# Patient Record
Sex: Female | Born: 2000 | Race: Black or African American | Hispanic: No | Marital: Single | Smoking: Never smoker
Health system: Southern US, Community
[De-identification: ages and names within clinical notes are randomized; demographics above are authoritative.]

## PROBLEM LIST (undated history)

## (undated) DIAGNOSIS — A599 Trichomoniasis, unspecified: Secondary | ICD-10-CM

## (undated) DIAGNOSIS — F32A Depression, unspecified: Secondary | ICD-10-CM

## (undated) DIAGNOSIS — F419 Anxiety disorder, unspecified: Secondary | ICD-10-CM

## (undated) DIAGNOSIS — J3501 Chronic tonsillitis: Secondary | ICD-10-CM

## (undated) DIAGNOSIS — A749 Chlamydial infection, unspecified: Secondary | ICD-10-CM

## (undated) DIAGNOSIS — D649 Anemia, unspecified: Secondary | ICD-10-CM

## (undated) HISTORY — PX: KNEE SURGERY: SHX244

---
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
## 2022-07-04 ENCOUNTER — Emergency Department (HOSPITAL_COMMUNITY)
Admission: EM | Admit: 2022-07-04 | Discharge: 2022-07-05 | Discharge status: Against Medical Advice | Payer: Medicaid Other | Attending: Emergency Medicine | Admitting: Emergency Medicine

## 2022-07-04 ENCOUNTER — Encounter (HOSPITAL_COMMUNITY): Payer: Self-pay | Admitting: Emergency Medicine

## 2022-07-04 ENCOUNTER — Emergency Department (HOSPITAL_COMMUNITY): Payer: Medicaid Other

## 2022-07-04 DIAGNOSIS — N939 Abnormal uterine and vaginal bleeding, unspecified: Secondary | ICD-10-CM | POA: Diagnosis present

## 2022-07-04 DIAGNOSIS — Z5321 Procedure and treatment not carried out due to patient leaving prior to being seen by health care provider: Secondary | ICD-10-CM | POA: Diagnosis not present

## 2022-07-04 LAB — URINALYSIS, ROUTINE W REFLEX MICROSCOPIC
Bacteria, UA: NONE SEEN
Bilirubin Urine: NEGATIVE
Glucose, UA: NEGATIVE mg/dL
Ketones, ur: NEGATIVE mg/dL
Leukocytes,Ua: NEGATIVE
Nitrite: NEGATIVE
Protein, ur: NEGATIVE mg/dL
Specific Gravity, Urine: 1.023 (ref 1.005–1.030)
pH: 5 (ref 5.0–8.0)

## 2022-07-04 LAB — CBC WITH DIFFERENTIAL/PLATELET
Abs Immature Granulocytes: 0.01 10*3/uL (ref 0.00–0.07)
Basophils Absolute: 0 10*3/uL (ref 0.0–0.1)
Basophils Relative: 1 %
Eosinophils Absolute: 0.1 10*3/uL (ref 0.0–0.5)
Eosinophils Relative: 2 %
HCT: 44.1 % (ref 36.0–46.0)
Hemoglobin: 14.3 g/dL (ref 12.0–15.0)
Immature Granulocytes: 0 %
Lymphocytes Relative: 43 %
Lymphs Abs: 2 10*3/uL (ref 0.7–4.0)
MCH: 27.6 pg (ref 26.0–34.0)
MCHC: 32.4 g/dL (ref 30.0–36.0)
MCV: 85 fL (ref 80.0–100.0)
Monocytes Absolute: 0.5 10*3/uL (ref 0.1–1.0)
Monocytes Relative: 10 %
Neutro Abs: 2.1 10*3/uL (ref 1.7–7.7)
Neutrophils Relative %: 44 %
Platelets: 271 10*3/uL (ref 150–400)
RBC: 5.19 MIL/uL — ABNORMAL HIGH (ref 3.87–5.11)
RDW: 12.4 % (ref 11.5–15.5)
WBC: 4.7 10*3/uL (ref 4.0–10.5)
nRBC: 0 % (ref 0.0–0.2)

## 2022-07-04 LAB — COMPREHENSIVE METABOLIC PANEL
ALT: 23 U/L (ref 0–44)
AST: 19 U/L (ref 15–41)
Albumin: 3.9 g/dL (ref 3.5–5.0)
Alkaline Phosphatase: 43 U/L (ref 38–126)
Anion gap: 11 (ref 5–15)
BUN: 7 mg/dL (ref 6–20)
CO2: 22 mmol/L (ref 22–32)
Calcium: 9.5 mg/dL (ref 8.9–10.3)
Chloride: 109 mmol/L (ref 98–111)
Creatinine, Ser: 0.72 mg/dL (ref 0.44–1.00)
GFR, Estimated: >60 mL/min (ref >60)
Glucose, Bld: 90 mg/dL (ref 70–99)
Potassium: 4.2 mmol/L (ref 3.5–5.1)
Sodium: 142 mmol/L (ref 135–145)
Total Bilirubin: 0.3 mg/dL (ref 0.3–1.2)
Total Protein: 7.5 g/dL (ref 6.5–8.1)

## 2022-07-04 LAB — I-STAT BETA HCG BLOOD, ED (MC, WL, AP ONLY): I-stat hCG, quantitative: <5 m[IU]/mL (ref <5)

## 2022-07-04 NOTE — ED Notes (Signed)
Patient seen leaving ED

## 2022-07-04 NOTE — ED Triage Notes (Signed)
Patient BIB [facility] from home with complaint of waking up "covered in blood" today. Patient states she is on depo-provera injections and does not normally have an menstrual cycle. Per EMS blood was bright red.  EMS Vitals BP 118/80 HR 106 SpO2 98%

## 2022-07-04 NOTE — ED Provider Triage Note (Signed)
Emergency Medicine Provider Triage Evaluation Note  Tiffany Aguilar , a 21 y.o. female  was evaluated in triage.  Pt complains of vaginal bleeding and abdominal pain.  Brought in by ambulance.  Patient is on the Depo shot and typically does not get a menstrual period.  States she believes she may have been pregnant in April, took a large number of pregnancy tests which were mostly negative.  She did have 1 positive.  Review of Systems  Positive: As above Negative: Chest pain, shortness of breath  Physical Exam  BP 121/88 (BP Location: Right Arm)   Pulse (!) 108   Temp 98.4 F (36.9 C) (Oral)   Resp 16   SpO2 100%  Gen:   Awake, no distress   Resp:  Normal effort  MSK:   Moves extremities without difficulty  Other:  GU exam deferred in triage  Medical Decision Making  Medically screening exam initiated at 6:55 PM.  Appropriate orders placed.  Tiffany Aguilar was informed that the remainder of the evaluation will be completed by another provider, this initial triage assessment does not replace that evaluation, and the importance of remaining in the ED until their evaluation is complete.  We will order labs and ultrasound   Tiffany Aguilar, Tiffany Aguilar 07/04/22 1856

## 2022-09-03 ENCOUNTER — Ambulatory Visit (HOSPITAL_COMMUNITY)
Admission: EM | Admit: 2022-09-03 | Discharge: 2022-09-03 | Disposition: A | Discharge status: Home / Self Care | Payer: Medicaid Other | Attending: Family Medicine | Admitting: Family Medicine

## 2022-09-03 ENCOUNTER — Encounter (HOSPITAL_COMMUNITY): Payer: Self-pay

## 2022-09-03 DIAGNOSIS — H53132 Sudden visual loss, left eye: Secondary | ICD-10-CM
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
## 2022-09-03 NOTE — Discharge Instructions (Addendum)
Please go immediately to Dr. Velvet Bathe office to be seen today.

## 2022-09-03 NOTE — ED Triage Notes (Signed)
Chief Complaint: left eye swelling, pain, and vision loss. No trauma to the eye. Mom thinks she was bit by a spider. No false lashes or contacts, foreign objects in or around the eye.   Onset: when waking 3 days ago. Swelling started yesterday.   OTC medications tried: None, tried warm compress no  Sick exposure: No  New foods or medications: Yes- cayenne pepper and paprika used for the first time  Recent Travel: No

## 2022-09-20 ENCOUNTER — Encounter (HOSPITAL_COMMUNITY): Payer: Self-pay

## 2022-09-20 ENCOUNTER — Ambulatory Visit (HOSPITAL_COMMUNITY)
Admission: EM | Admit: 2022-09-20 | Discharge: 2022-09-20 | Disposition: A | Discharge status: Home / Self Care | Payer: Medicaid Other | Attending: Family Medicine | Admitting: Family Medicine

## 2022-09-20 DIAGNOSIS — U071 COVID-19: Secondary | ICD-10-CM

## 2022-09-20 DIAGNOSIS — R112 Nausea with vomiting, unspecified: Secondary | ICD-10-CM

## 2022-09-20 MED ORDER — ONDANSETRON 4 MG PO TBDP: 4.0000 mg | ORAL_TABLET | Freq: Three times a day (TID) | ORAL | 0 refills | Status: DC | PRN

## 2022-09-20 NOTE — ED Provider Notes (Signed)
[facility]    CSN: [id number] Arrival date & time: 09/20/22  1128      History   Chief Complaint Chief Complaint  Patient presents with   Covid Positive    HPI Tiffany Aguilar is a 21 y.o. female.   HPI Here for nasal congestion and cough and vomiting that began yesterday.  Is currently about 1500, and she is thrown up about 4 times today.  She is managing to keep down the Sprite that was offered her by the clinic staff, however.  She has had sore throat and myalgia.  No shortness of breath.  She did a home COVID test that was positive last night.  Last menstrual period was couple of months ago, she gets a Depo-Provera for contraception.   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      Home Medications    Prior to Admission medications   Medication Sig Start Date End Date Taking? Authorizing Provider  ondansetron (ZOFRAN-ODT) 4 MG disintegrating tablet Take 1 tablet (4 mg total) by mouth every 8 (eight) hours as needed for nausea or vomiting. 09/20/22  Yes Barrett Henle, MD    Family History Family History  Problem Relation Age of Onset   Hypertension Maternal Grandfather     Social History Social History   Tobacco Use   Smoking status: Never   Smokeless tobacco: Never  Vaping Use   Vaping Use: Never used  Substance Use Topics   Alcohol use: Never   Drug use: Never     Allergies   Blueberry [vaccinium angustifolium] and Kiwi extract   Review of Systems Review of Systems   Physical Exam Triage Vital Signs ED Triage Vitals  Enc Vitals  Group     BP 09/20/22 1431 107/71     Pulse Rate 09/20/22 1431 (!) 101     Resp 09/20/22 1431 18     Temp 09/20/22 1431 98 F (36.7 C)     Temp Source 09/20/22 1431 Oral     SpO2 09/20/22 1431 97 %     Weight --      Height --      Head Circumference --      Peak Flow --      Pain Score 09/20/22 1427 6     Pain Loc --      Pain Edu? --      Excl. in [place]? --    No data found.  Updated Vital Signs BP 107/71 (BP Location: Right Arm)   Pulse (!) 101   Temp 98 F (36.7 C) (Oral)   Resp 18   LMP  (LMP Unknown)   SpO2 97%   Visual Acuity Right Eye Distance:   Left Eye Distance:   Bilateral Distance:    Right Eye Near:   Left Eye Near:    Bilateral Near:     Physical Exam Vitals reviewed.  Constitutional:      General: She is not in acute distress.    Appearance: She is not toxic-appearing.  HENT:     Right Ear: Tympanic membrane and ear canal normal.     Left Ear: Tympanic membrane and ear canal normal.     Nose: Nose normal.     Mouth/Throat:     Mouth: Mucous membranes are moist.     Pharynx: No oropharyngeal exudate or posterior oropharyngeal erythema.  Eyes:     Extraocular Movements: Extraocular movements intact.     Conjunctiva/sclera: Conjunctivae normal.     Pupils: Pupils are equal, round, and reactive to light.  Cardiovascular:     Rate and Rhythm: Normal rate and regular rhythm.     Heart sounds: No murmur heard. Pulmonary:     Effort: Pulmonary effort is normal. No respiratory distress.     Breath sounds: No stridor. No wheezing, rhonchi or rales.  Abdominal:     Palpations: Abdomen is soft.     Tenderness: There is no abdominal tenderness.  Musculoskeletal:     Cervical back: Neck supple.  Lymphadenopathy:     Cervical: No cervical adenopathy.  Skin:    Capillary Refill: Capillary refill takes less than 2 seconds.     Coloration: Skin is not jaundiced or pale.  Neurological:     General: No focal deficit present.     Mental Status: She is  alert and oriented to person, place, and time.  Psychiatric:        Behavior: Behavior normal.      UC Treatments / Results  Labs (all labs ordered are listed, but only abnormal results are displayed) Labs Reviewed - No data to display  EKG   Radiology No results found.  Procedures Procedures (including critical care time)  Medications Ordered in UC Medications - No data to display  Initial Impression / Assessment and Plan / UC Course  I have reviewed the triage vital signs and the nursing notes.  Pertinent labs & imaging results that were available during my care of the patient were reviewed by me and considered in my medical decision making (see chart for details).        Zofran sent for her nausea.  Work note done for her quarantine. Final Clinical Impressions(s) / UC Diagnoses   Final diagnoses:  COVID  Nausea and vomiting, unspecified vomiting type     Discharge Instructions      Ondansetron dissolved in the mouth every 8 hours as needed for nausea or vomiting. Clear liquids and bland things to eat.       ED Prescriptions     Medication Sig Dispense Auth. Provider   ondansetron (ZOFRAN-ODT) 4 MG disintegrating tablet Take 1 tablet (4 mg total) by mouth every 8 (eight) hours as needed for nausea or vomiting. 10 tablet Windy Carina Gwenlyn Perking, MD      PDMP not reviewed this encounter.   Barrett Henle, MD 09/20/22 [phone]

## 2022-09-20 NOTE — Discharge Instructions (Signed)
Ondansetron dissolved in the mouth every 8 hours as needed for nausea or vomiting. Clear liquids and bland things to eat.

## 2022-09-20 NOTE — ED Triage Notes (Signed)
Pt tested positive for covid last night . Pt state she has a cough, headache, sore throat , back pain , low energy vomiting , diarrhea , fever , body aches and chills since last night

## 2022-12-18 ENCOUNTER — Encounter (HOSPITAL_COMMUNITY): Payer: Self-pay

## 2022-12-18 ENCOUNTER — Ambulatory Visit (HOSPITAL_COMMUNITY)
Admission: RE | Admit: 2022-12-18 | Discharge: 2022-12-18 | Disposition: A | Discharge status: Home / Self Care | Payer: Medicaid Other | Source: Ambulatory Visit | Attending: Internal Medicine | Admitting: Internal Medicine

## 2022-12-18 VITALS — BP 105/67 | HR 90 | Temp 98.4°F | Resp 14

## 2022-12-18 DIAGNOSIS — N898 Other specified noninflammatory disorders of vagina: Secondary | ICD-10-CM

## 2022-12-18 NOTE — ED Triage Notes (Signed)
[facility] and was [facility] for Trich and Chlamydia. Completed treatments for both and when went to make an appt yesterday with them for re-testing was told couldn't be seen for 3 months.  Pt requesting testing to make sure all clear. Denies any symptoms except odor.

## 2022-12-18 NOTE — ED Provider Notes (Signed)
[place]    CSN: [id number] Arrival date & time: 12/18/22  1039      History   Chief Complaint Chief Complaint  Patient presents with   SEXUALLY TRANSMITTED DISEASE    HPI Tiffany Aguilar is a 22 y.o. female.   Patient presents to urgent care for retesting after she tested positive for trichomonas and chlamydia during a [facility] a couple of weeks ago.  Patient was seen at the health department on December 02, 2022 where they told her that she was positive for trichomonas that day.  She started treatment with Flagyl for trichomoniasis on December 07, 2022 and took Flagyl twice daily for 7 days as prescribed.  On December 10, 2022, the health department called patient and told her that she also tested positive for chlamydia.  She started taking doxycycline twice daily for 7 days on December 10, 2022 and states she finished this medication yesterday. She states she continues to have vaginal odor. Dysuria, vaginal discharge, and vaginal irritation.  She has not had any recent new sexual partners since she was tested/treated for STDs.  Denies chance of pregnancy, receives Depo injection.  Patient would like to be retested today to "make sure she is in the clear".  Called the health department to schedule a follow-up appointment and was told that they do not have an appointment for her for 3 months.     Past Medical History:  Diagnosis Date   Anemia    Anxiety    Depression     Patient Active Problem List   Diagnosis Date Noted   MDD (major depressive disorder), single episode, severe with psychosis ([facility]) 01/18/2020   Iron deficiency anemia 01/17/2020   Menorrhagia 01/17/2020   Suicide attempt ([facility]) 01/15/2020    Past Surgical History:  Procedure Laterality Date   KNEE SURGERY      OB History   No obstetric history on file.      Home Medications    Prior to Admission medications   Medication Sig Start Date End Date Taking? Authorizing  Provider  ondansetron (ZOFRAN-ODT) 4 MG disintegrating tablet Take 1 tablet (4 mg total) by mouth every 8 (eight) hours as needed for nausea or vomiting. 09/20/22   Barrett Henle, MD    Family History Family History  Problem Relation Age of Onset   Hypertension Maternal Grandfather     Social History Social History   Tobacco Use   Smoking status: Never   Smokeless tobacco: Never  Vaping Use   Vaping Use: Never used  Substance Use Topics   Alcohol use: Never   Drug use: Never     Allergies   Blueberry [vaccinium angustifolium] and Kiwi extract   Review of Systems Review of Systems Per HPI  Physical Exam Triage Vital Signs ED Triage Vitals  Enc Vitals Group     BP 12/18/22 1112 105/67     Pulse Rate 12/18/22 1112 90     Resp 12/18/22 1112 14     Temp 12/18/22 1112 98.4 F (36.9 C)     Temp Source 12/18/22 1112 Oral     SpO2 12/18/22 1112 96 %     Weight --      Height --      Head Circumference --      Peak Flow --      Pain Score 12/18/22 1111 0     Pain Loc --      Pain Edu? --  Excl. in GC? --    No data found.  Updated Vital Signs BP 105/67 (BP Location: Left Arm)   Pulse 90   Temp 98.4 F (36.9 C) (Oral)   Resp 14   SpO2 96%   Visual Acuity Right Eye Distance:   Left Eye Distance:   Bilateral Distance:    Right Eye Near:   Left Eye Near:    Bilateral Near:     Physical Exam Vitals and nursing note reviewed.  Constitutional:      Appearance: She is not ill-appearing or toxic-appearing.  HENT:     Head: Normocephalic and atraumatic.     Right Ear: Hearing and external ear normal.     Left Ear: Hearing and external ear normal.     Nose: Nose normal.     Mouth/Throat:     Lips: Pink.  Eyes:     General: Lids are normal. Vision grossly intact. Gaze aligned appropriately.     Extraocular Movements: Extraocular movements intact.     Conjunctiva/sclera: Conjunctivae normal.  Pulmonary:     Effort: Pulmonary effort is normal.   Musculoskeletal:     Cervical back: Neck supple.  Skin:    General: Skin is warm and dry.     Capillary Refill: Capillary refill takes less than 2 seconds.     Findings: No rash.  Neurological:     General: No focal deficit present.     Mental Status: She is alert and oriented to person, place, and time. Mental status is at baseline.     Cranial Nerves: No dysarthria or facial asymmetry.  Psychiatric:        Mood and Affect: Mood normal.        Speech: Speech normal.        Behavior: Behavior normal.        Thought Content: Thought content normal.        Judgment: Judgment normal.      UC Treatments / Results  Labs (all labs ordered are listed, but only abnormal results are displayed) Labs Reviewed - No data to display  EKG   Radiology No results found.  Procedures Procedures (including critical care time)  Medications Ordered in UC Medications - No data to display  Initial Impression / Assessment and Plan / UC Course  I have reviewed the triage vital signs and the nursing notes.  Pertinent labs & imaging results that were available during my care of the patient were reviewed by me and considered in my medical decision making (see chart for details).   1.  Vaginal odor Per guidelines, test of cure for trichomonas is recommended at least 3 weeks to 3 months after completing treatment and test of cure for chlamydia should be done at least 4 weeks after treatment.  Advised patient to return to urgent care the week of January 18, 2023 for retesting should she continue to experience vaginal symptoms.  If there is new concern for new exposure to STD, patient to return to urgent care sooner for retesting.  She is agreeable with this plan and is not currently experiencing any urinary symptoms or systemic symptoms of infection.   Discussed physical exam and available lab work findings in clinic with patient.  Counseled patient regarding appropriate use of medications and potential  side effects for all medications recommended or prescribed today. Discussed red flag signs and symptoms of worsening condition,when to call the PCP office, return to urgent care, and when to seek higher level of  care in the emergency department. Patient verbalizes understanding and agreement with plan. All questions answered. Patient discharged in stable condition.    Final Clinical Impressions(s) / UC Diagnoses   Final diagnoses:  Vaginal odor     Discharge Instructions      Please return to urgent care for re-testing if you still have symptoms in 4 weeks on January 18, 2023.  No need to re-test for cure until then based on guidelines.  You will need to be re-tested for chlamydia in 3 months since you had a positive test a couple weeks ago.   Return to urgent care as needed.     ED Prescriptions   None    PDMP not reviewed this encounter.   Talbot Grumbling,  12/18/22 1149

## 2022-12-18 NOTE — Discharge Instructions (Addendum)
Please return to urgent care for re-testing if you still have symptoms in 4 weeks on January 18, 2023.  No need to re-test for cure until then based on guidelines.  You will need to be re-tested for chlamydia in 3 months since you had a positive test a couple weeks ago.   Return to urgent care as needed.

## 2023-01-23 ENCOUNTER — Ambulatory Visit (HOSPITAL_COMMUNITY)
Admission: EM | Admit: 2023-01-23 | Discharge: 2023-01-23 | Disposition: A | Discharge status: Home / Self Care | Payer: Medicaid Other | Attending: Family Medicine | Admitting: Family Medicine

## 2023-01-23 ENCOUNTER — Encounter (HOSPITAL_COMMUNITY): Payer: Self-pay | Admitting: *Deleted

## 2023-01-23 DIAGNOSIS — Z113 Encounter for screening for infections with a predominantly sexual mode of transmission: Secondary | ICD-10-CM

## 2023-01-23 HISTORY — DX: Chlamydial infection, unspecified: A74.9

## 2023-01-23 HISTORY — DX: Trichomoniasis, unspecified: A59.9

## 2023-01-23 NOTE — Discharge Instructions (Signed)
We have sent testing for sexually transmitted infections. We will notify you of any positive results once they are received. If required, we will prescribe any medications you might need. °

## 2023-01-23 NOTE — ED Provider Notes (Signed)
  [facility]   [id number] 01/23/23 Arrival Time: 1123  ASSESSMENT & PLAN:  1. Screening for STD (sexually transmitted disease)       Discharge Instructions      We have sent testing for sexually transmitted infections. We will notify you of any positive results once they are received. If required, we will prescribe any medications you might need.      Without s/s of PID.  Labs Reviewed  CERVICOVAGINAL ANCILLARY ONLY    Pending: Unresulted Labs (From admission, onward)    None        Will notify of any positive results. Instructed to refrain from sexual activity for at least seven days.  Reviewed expectations re: course of current medical issues. Questions answered. Outlined signs and symptoms indicating need for more acute intervention. Patient verbalized understanding. After Visit Summary given.   SUBJECTIVE:  Tiffany Aguilar is a 22 y.o. female who reports [facility] & completed treatment for both. Pt states she wishes to be tested again to make sure she is clear. Denies any vaginal discharge or any sxs.   Patient's last menstrual period was 01/19/2023 (exact date).   OBJECTIVE:  Vitals:   01/23/23 1135  BP: 115/76  Pulse: 85  Resp: 16  Temp: 98.2 F (36.8 C)  TempSrc: Oral  SpO2: 99%     General appearance: alert, cooperative, appears stated age and no distress Abdomen: softr GU: deferred Skin: warm and dry Psychological: alert and cooperative; normal mood and affect.    Labs Reviewed  CERVICOVAGINAL ANCILLARY ONLY    Allergies  Allergen Reactions   Blueberry [Vaccinium Angustifolium]    Kiwi Extract     Past Medical History:  Diagnosis Date   Anemia    Anxiety    Chlamydia    Depression    Trichomoniasis    Family History  Problem Relation Age of Onset   Hypertension Maternal Grandfather    Social History   Socioeconomic History   Marital status: Single     Spouse name: Not on file   Number of children: Not on file   Years of education: Not on file   Highest education level: Not on file  Occupational History   Not on file  Tobacco Use   Smoking status: Never   Smokeless tobacco: Never  Vaping Use   Vaping Use: Never used  Substance and Sexual Activity   Alcohol use: Yes    Comment: occasionally   Drug use: Never   Sexual activity: Not Currently    Birth control/protection: None  Other Topics Concern   Not on file  Social History Narrative   Mom, dad, sister, 3 brothers and 5 Dogs   Social Determinants of Health   Financial Resource Strain: Not on file  Food Insecurity: Not on file  Transportation Needs: Not on file  Physical Activity: Not on file  Stress: Not on file  Social Connections: Not on file  Intimate Partner Violence: Not on file           [place], MD 01/23/23 1216

## 2023-01-23 NOTE — ED Triage Notes (Signed)
Pt reports [facility] & completed treatment for both. Pt states she wishes to be tested again to make sure she is clear. Denies any vaginal discharge or any sxs.

## 2023-01-25 LAB — CERVICOVAGINAL ANCILLARY ONLY
Bacterial Vaginitis (gardnerella): NEGATIVE
Chlamydia: NEGATIVE
Comment: NEGATIVE
Comment: NEGATIVE
Comment: NEGATIVE
Comment: NORMAL
Neisseria Gonorrhea: NEGATIVE
Trichomonas: NEGATIVE

## 2023-02-12 ENCOUNTER — Ambulatory Visit (HOSPITAL_COMMUNITY)
Admission: EM | Admit: 2023-02-12 | Discharge: 2023-02-12 | Disposition: A | Discharge status: Home / Self Care | Payer: Medicaid Other | Attending: Internal Medicine | Admitting: Internal Medicine

## 2023-02-12 ENCOUNTER — Encounter (HOSPITAL_COMMUNITY): Payer: Self-pay

## 2023-02-12 DIAGNOSIS — Z3202 Encounter for pregnancy test, result negative: Secondary | ICD-10-CM | POA: Diagnosis present

## 2023-02-12 DIAGNOSIS — R110 Nausea: Secondary | ICD-10-CM | POA: Insufficient documentation

## 2023-02-12 DIAGNOSIS — R350 Frequency of micturition: Secondary | ICD-10-CM | POA: Diagnosis present

## 2023-02-12 DIAGNOSIS — N644 Mastodynia: Secondary | ICD-10-CM | POA: Diagnosis present

## 2023-02-12 LAB — POCT URINALYSIS DIP (MANUAL ENTRY)
Bilirubin, UA: NEGATIVE
Blood, UA: NEGATIVE
Glucose, UA: NEGATIVE mg/dL
Ketones, POC UA: NEGATIVE mg/dL
Leukocytes, UA: NEGATIVE
Nitrite, UA: NEGATIVE
Protein Ur, POC: NEGATIVE mg/dL
Spec Grav, UA: >=1.03 — AB (ref 1.010–1.025)
Urobilinogen, UA: 1 E.U./dL
pH, UA: 5.5 (ref 5.0–8.0)

## 2023-02-12 LAB — POCT URINE PREGNANCY: Preg Test, Ur: NEGATIVE

## 2023-02-12 NOTE — Discharge Instructions (Addendum)
Urine pregnancy test was negative.  Urine test did not show any obvious abnormalities.  Your vaginal swab, blood work for pregnancy, basic blood work is pending.  Will call if it is abnormal and treat as appropriate.  It would be reasonable for you to follow-up with your family medicine doctor and gynecology at provided contact information for follow-up.

## 2023-02-12 NOTE — ED Provider Notes (Signed)
[facility]    CSN: [id number] Arrival date & time: 02/12/23  1001      History   Chief Complaint Chief Complaint  Patient presents with   requesting pregnancy test    HPI Tiffany Aguilar is a 22 y.o. female.   Patient presents today requesting a pregnancy test.  Patient reports that her last menstrual cycle was 01/19/2023.  She states that she was previously not having regular menstrual cycles given that she was taking birth control.  She recently stopped birth control and the most recent menstrual cycle was the first 1 since she stopped birth control.  She is concerned for pregnancy given 2-week history of bilateral breast tenderness, nausea without vomiting, mild urinary frequency.  Reports she took 2 pregnancy tests at home that were negative.  She does have some mild clear vaginal discharge that has been present for a few days as well.  Although, she denies any exposure to STD.  Reports she was recently tested for STDs but has had unprotected sexual intercourse since that testing occurred.  She has not noticed any lumps or rashes to her breasts.  Reports she was pregnant in September 2023 but states that she fell which caused a miscarriage.  Denies abdominal pain, vaginal bleeding, dysuria, diarrhea, constipation.  Denies any fever.     Past Medical History:  Diagnosis Date   Anemia    Anxiety    Chlamydia    Depression    Trichomoniasis     Patient Active Problem List   Diagnosis Date Noted   MDD (major depressive disorder), single episode, severe with psychosis ([facility]) 01/18/2020   Iron deficiency anemia 01/17/2020   Menorrhagia 01/17/2020   Suicide attempt ([facility]) 01/15/2020    Past Surgical History:  Procedure Laterality Date   KNEE SURGERY      OB History   No obstetric history on file.      Home Medications    Prior to Admission medications   Medication Sig Start Date End Date Taking? Authorizing Provider  ondansetron (ZOFRAN-ODT) 4 MG  disintegrating tablet Take 1 tablet (4 mg total) by mouth every 8 (eight) hours as needed for nausea or vomiting. 09/20/22   Zenia Resides, MD    Family History Family History  Problem Relation Age of Onset   Hypertension Maternal Grandfather     Social History Social History   Tobacco Use   Smoking status: Never   Smokeless tobacco: Never  Vaping Use   Vaping Use: Never used  Substance Use Topics   Alcohol use: Yes    Comment: occasionally   Drug use: Never     Allergies   Blueberry [vaccinium angustifolium] and Kiwi extract   Review of Systems Review of Systems Per HPI  Physical Exam Triage Vital Signs ED Triage Vitals  Enc Vitals Group     BP 02/12/23 1116 120/81     Pulse Rate 02/12/23 1116 93     Resp 02/12/23 1116 16     Temp 02/12/23 1116 98 F (36.7 C)     Temp Source 02/12/23 1116 Oral     SpO2 02/12/23 1116 99 %     Weight --      Height --      Head Circumference --      Peak Flow --      Pain Score 02/12/23 1117 7     Pain Loc --      Pain Edu? --      Excl. in  GC? --    No data found.  Updated Vital Signs BP 120/81 (BP Location: Left Arm)   Pulse 93   Temp 98 F (36.7 C) (Oral)   Resp 16   LMP 01/19/2023 (Exact Date)   SpO2 99%   Visual Acuity Right Eye Distance:   Left Eye Distance:   Bilateral Distance:    Right Eye Near:   Left Eye Near:    Bilateral Near:     Physical Exam Exam conducted with a chaperone present.  Constitutional:      General: She is not in acute distress.    Appearance: Normal appearance. She is not toxic-appearing or diaphoretic.  HENT:     Head: Normocephalic and atraumatic.  Eyes:     Extraocular Movements: Extraocular movements intact.     Conjunctiva/sclera: Conjunctivae normal.  Pulmonary:     Effort: Pulmonary effort is normal.  Chest:     Comments: Tenderness to palpation throughout bilateral breasts.  There are no obvious lumps, masses, rashes noted.  Nipples appear  normal. Genitourinary:    Comments: Deferred with shared decision making.  Self swab performed. Neurological:     General: No focal deficit present.     Mental Status: She is alert and oriented to person, place, and time. Mental status is at baseline.  Psychiatric:        Mood and Affect: Mood normal.        Behavior: Behavior normal.        Thought Content: Thought content normal.        Judgment: Judgment normal.      UC Treatments / Results  Labs (all labs ordered are listed, but only abnormal results are displayed) Labs Reviewed  POCT URINALYSIS DIP (MANUAL ENTRY) - Abnormal; Notable for the following components:      Result Value   Color, UA straw (*)    Spec Grav, UA >=1.030 (*)    All other components within normal limits  POCT URINE PREGNANCY  CERVICOVAGINAL ANCILLARY ONLY    EKG   Radiology No results found.  Procedures Procedures (including critical care time)  Medications Ordered in UC Medications - No data to display  Initial Impression / Assessment and Plan / UC Course  I have reviewed the triage vital signs and the nursing notes.  Pertinent labs & imaging results that were available during my care of the patient were reviewed by me and considered in my medical decision making (see chart for details).     Urine pregnancy test was negative.  Discussed obtaining a quantitative hCG given patient is having typical pregnancy symptoms despite urine pregnancy test being negative.  She was agreeable to this but clinical staff was not able to obtain blood work.  Unsure exact etiology of patient's symptoms but it is possible could be related to recently coming off of birth control.  Advised patient to follow-up with gynecology for further evaluation and management of this at provided contact information.  Breast exam is not notable for any lumps or lesions that need any type of imaging completed at this time.  No signs of infection either.  CMP and CBC was ordered  but as we discussed, clinical staff was not able to obtain blood work.  There are no signs of acute abdomen on exam so do not think that emergent evaluation is necessary.  Therefore, patient was advised of the importance of following up with her family medicine doctor for recheck and follow-up.  UA was completed given  urinary frequency but it was unremarkable.  Will obtain cervicovaginal swab given patient's reported vaginal discharge.  Discussed strict return and ER precautions.  Patient verbalized understanding and was agreeable with plan. Final Clinical Impressions(s) / UC Diagnoses   Final diagnoses:  Urine pregnancy test negative  Nausea without vomiting  Breast tenderness  Urinary frequency     Discharge Instructions      Urine pregnancy test was negative.  Urine test did not show any obvious abnormalities.  Your vaginal swab, blood work for pregnancy, basic blood work is pending.  Will call if it is abnormal and treat as appropriate.  It would be reasonable for you to follow-up with your family medicine doctor and gynecology at provided contact information for follow-up.     ED Prescriptions   None    PDMP not reviewed this encounter.   Gustavus Bryant, [place] 02/12/23 1312

## 2023-02-12 NOTE — ED Triage Notes (Signed)
Patient is requesting a pregnancy test. Patient states that she had 2 home pregnancy tests that were negative. Patient states she has sore nipples, darkness around the nipple area, and nausea.

## 2023-02-12 NOTE — ED Notes (Signed)
Unable to obtain blood work. Attempts x 2 with 2 different staff members. Lavonne Chick., NP notified and was told to discharge the patient without blood work.

## 2023-02-15 LAB — CERVICOVAGINAL ANCILLARY ONLY
Bacterial Vaginitis (gardnerella): NEGATIVE
Candida Glabrata: POSITIVE — AB
Candida Vaginitis: NEGATIVE
Chlamydia: NEGATIVE
Comment: NEGATIVE
Comment: NEGATIVE
Comment: NEGATIVE
Comment: NEGATIVE
Comment: NEGATIVE
Comment: NORMAL
Neisseria Gonorrhea: NEGATIVE
Trichomonas: NEGATIVE

## 2023-02-16 ENCOUNTER — Telehealth (HOSPITAL_COMMUNITY): Payer: Self-pay | Admitting: Emergency Medicine

## 2023-02-16 MED ORDER — FLUCONAZOLE 150 MG PO TABS: 150.0000 mg | ORAL_TABLET | Freq: Once | ORAL | 0 refills | Status: AC

## 2023-03-01 ENCOUNTER — Ambulatory Visit (HOSPITAL_COMMUNITY)
Admission: EM | Admit: 2023-03-01 | Discharge: 2023-03-01 | Disposition: A | Discharge status: Home / Self Care | Payer: Medicaid Other | Attending: Internal Medicine | Admitting: Internal Medicine

## 2023-03-01 ENCOUNTER — Encounter (HOSPITAL_COMMUNITY): Payer: Self-pay | Admitting: *Deleted

## 2023-03-01 ENCOUNTER — Other Ambulatory Visit: Payer: Self-pay

## 2023-03-01 DIAGNOSIS — J039 Acute tonsillitis, unspecified: Secondary | ICD-10-CM | POA: Diagnosis present

## 2023-03-01 LAB — POCT URINE PREGNANCY: Preg Test, Ur: NEGATIVE

## 2023-03-01 LAB — POCT RAPID STREP A (OFFICE): Rapid Strep A Screen: NEGATIVE

## 2023-03-01 MED ORDER — ACETAMINOPHEN 160 MG/5ML PO SOLN
500.0000 mg | Freq: Once | ORAL | Status: AC
  Administered 2023-03-01: 500 mg via ORAL

## 2023-03-01 MED ORDER — ONDANSETRON 4 MG PO TBDP: 4.0000 mg | ORAL_TABLET | Freq: Two times a day (BID) | ORAL | 0 refills | Status: AC

## 2023-03-01 MED ORDER — ONDANSETRON 4 MG PO TBDP
4.0000 mg | ORAL_TABLET | Freq: Once | ORAL | Status: AC
  Administered 2023-03-01: 4 mg via ORAL

## 2023-03-01 MED ORDER — ONDANSETRON 4 MG PO TBDP
ORAL_TABLET | ORAL | Status: AC
  Filled 2023-03-01: qty 1

## 2023-03-01 MED ORDER — ACETAMINOPHEN 160 MG/5ML PO SUSP
ORAL | Status: AC
  Filled 2023-03-01: qty 20

## 2023-03-01 MED ORDER — AMOXICILLIN 400 MG/5ML PO SUSR: 500.0000 mg | Freq: Two times a day (BID) | ORAL | 0 refills | Status: AC

## 2023-03-01 NOTE — ED Provider Notes (Signed)
[facility]    CSN: [id number] Arrival date & time: 03/01/23  1013     History   Chief Complaint Chief Complaint  Patient presents with   Sore Throat   Headache   Generalized Body Aches   Emesis    HPI Tiffany Aguilar is a 22 y.o. female.  Yesterday developed sore throat, pain with swallowing.  Reports 10/10 pain today. Also had bodyaches and chills last night.  No known fever.  Denies cough Reports a few episodes of vomiting last night into early this morning. 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      Home Medications    Prior to Admission medications   Medication Sig Start Date End Date Taking? Authorizing Provider  amoxicillin (AMOXIL) 400 MG/5ML suspension Take 6.3 mLs (500 mg total) by mouth 2 (two) times daily for 10 days. 03/01/23 03/11/23 Yes Floyd Lusignan, PA-C  ondansetron (ZOFRAN-ODT) 4 MG disintegrating tablet Take 1 tablet (4 mg total) by mouth 2 (two) times daily for 4 days. Dissolve 1 tablet under the tongue 30 minutes before antibiotic use 03/01/23 03/05/23 Yes Galit Urich, Lurena Joiner, PA-C    Family History Family History  Problem Relation Age of Onset   Hypertension Maternal Grandfather     Social History Social History   Tobacco Use   Smoking status: Never   Smokeless tobacco: Never  Vaping Use   Vaping Use: Never used  Substance Use Topics   Alcohol use: Yes    Comment: occasionally   Drug use: Never      Allergies   Blueberry [vaccinium angustifolium] and Kiwi extract   Review of Systems Review of Systems As per HPI  Physical Exam Triage Vital Signs ED Triage Vitals  Enc Vitals Group     BP 03/01/23 1144 106/73     Pulse Rate 03/01/23 1144 (!) 110     Resp 03/01/23 1144 18     Temp 03/01/23 1144 98.3 F (36.8 C)     Temp src --      SpO2 03/01/23 1144 98 %     Weight --      Height --      Head Circumference --      Peak Flow --      Pain Score 03/01/23 1142 10     Pain Loc --      Pain Edu? --      Excl. in GC? --    No data found.  Updated Vital Signs BP 106/73   Pulse (!) 110   Temp 98.3 F (36.8 C)   Resp 18   LMP 01/18/2023   SpO2 98%     Physical Exam Vitals and nursing note reviewed.  Constitutional:      General: She is not in acute distress.    Appearance: She is not ill-appearing.  HENT:     Right  Ear: Tympanic membrane and ear canal normal.     Left Ear: Tympanic membrane and ear canal normal.     Mouth/Throat:     Mouth: Mucous membranes are moist.     Pharynx: Oropharynx is clear. Posterior oropharyngeal erythema present.     Tonsils: Tonsillar exudate present. No tonsillar abscesses. 3+ on the right. 3+ on the left.     Comments: Erythematous throat and tonsils. 3+ swelling with copious exudate bilaterally. Tolerating secretions and normal phonation             Eyes:     Conjunctiva/sclera: Conjunctivae normal.  Cardiovascular:     Rate and Rhythm: Normal rate and regular rhythm.     Heart sounds: Normal heart sounds.  Pulmonary:     Effort: Pulmonary effort is normal.     Breath sounds: Normal breath sounds.  Abdominal:     Tenderness: There is no abdominal tenderness.  Musculoskeletal:        General: Normal range of motion.     Cervical back: Normal range of motion. No rigidity.  Lymphadenopathy:     Cervical: No cervical adenopathy.  Skin:    General: Skin is warm and dry.     Findings: No rash.  Neurological:      Mental Status: She is alert and oriented to person, place, and time.     UC Treatments / Results  Labs (all labs ordered are listed, but only abnormal results are displayed) Labs Reviewed  CULTURE, GROUP A STREP [facility])  POCT RAPID STREP A (OFFICE)  POCT URINE PREGNANCY    EKG  Radiology No results found.  Procedures Procedures (including critical care time)  Medications Ordered in UC Medications  ondansetron (ZOFRAN-ODT) disintegrating tablet 4 mg (4 mg Oral Given 03/01/23 1235)  acetaminophen (TYLENOL) 160 MG/5ML solution 500 mg (500 mg Oral Given 03/01/23 1234)    Initial Impression / Assessment and Plan / UC Course  I have reviewed the triage vital signs and the nursing notes.  Pertinent labs & imaging results that were available during my care of the patient were reviewed by me and considered in my medical decision making (see chart for details).  UPT negative Rapid strep negative, culture is pending. Given appearance will start empirical treatment for tonsillitis with amox BID x 10 days. D/C if culture negative.  Tylenol dose given in clinic for pain, zofran for nausea. PO challenge successful. Recommend zofran before taking abx. Tylenol for pain control. School note provided. Return precautions discussed. Patient agrees to plan  Final Clinical Impressions(s) / UC Diagnoses   Final diagnoses:  Acute tonsillitis, unspecified etiology     Discharge Instructions      I am treating you for a throat infection.  However we are still sending the throat swab off for culture.  If nothing grows, we will call you and have you discontinue the antibiotic.  For now take the antibiotic amoxicillin twice daily (every 12 hours) for 10 days. I recommend to use Zofran/nausea medicine about 30 minutes before taking the antibiotic. This will help to keep the stomach settled. Use for the next 2-3 days while you are getting appetite back. If you can, eat something before taking the  antibiotic as it can upset you on an empty stomach.   Drink lots of water.  Take tylenol every 4-6 hours to control pain in the meantime.  Please change your toothbrush in 2-3 days to prevent reinfection.     ED Prescriptions  Medication Sig Dispense Auth. Provider   amoxicillin (AMOXIL) 400 MG/5ML suspension Take 6.3 mLs (500 mg total) by mouth 2 (two) times daily for 10 days. 126 mL Medard Decuir, PA-C   ondansetron (ZOFRAN-ODT) 4 MG disintegrating tablet Take 1 tablet (4 mg total) by mouth 2 (two) times daily for 4 days. Dissolve 1 tablet under the tongue 30 minutes before antibiotic use 8 tablet Reese Senk, Lurena Joiner, PA-C      PDMP not reviewed this encounter.   Kathrine Haddock 03/01/23 1341

## 2023-03-01 NOTE — ED Triage Notes (Signed)
Pt reports since Sunday morning she has had body aches,HA,sore throat has vomited

## 2023-03-01 NOTE — Discharge Instructions (Addendum)
I am treating you for a throat infection.  However we are still sending the throat swab off for culture.  If nothing grows, we will call you and have you discontinue the antibiotic.  For now take the antibiotic amoxicillin twice daily (every 12 hours) for 10 days. I recommend to use Zofran/nausea medicine about 30 minutes before taking the antibiotic. This will help to keep the stomach settled. Use for the next 2-3 days while you are getting appetite back. If you can, eat something before taking the antibiotic as it can upset you on an empty stomach.   Drink lots of water.  Take tylenol every 4-6 hours to control pain in the meantime.  Please change your toothbrush in 2-3 days to prevent reinfection.

## 2023-03-02 ENCOUNTER — Other Ambulatory Visit: Payer: Self-pay

## 2023-03-02 ENCOUNTER — Emergency Department (HOSPITAL_COMMUNITY)
Admission: EM | Admit: 2023-03-02 | Discharge: 2023-03-02 | Disposition: A | Discharge status: Home / Self Care | Payer: Medicaid Other | Attending: Emergency Medicine | Admitting: Emergency Medicine

## 2023-03-02 ENCOUNTER — Encounter (HOSPITAL_COMMUNITY): Payer: Self-pay

## 2023-03-02 DIAGNOSIS — J029 Acute pharyngitis, unspecified: Secondary | ICD-10-CM | POA: Diagnosis present

## 2023-03-02 DIAGNOSIS — Z1152 Encounter for screening for COVID-19: Secondary | ICD-10-CM | POA: Insufficient documentation

## 2023-03-02 LAB — SARS CORONAVIRUS 2 BY RT PCR: SARS Coronavirus 2 by RT PCR: NEGATIVE

## 2023-03-02 LAB — GROUP A STREP BY PCR: Group A Strep by PCR: NOT DETECTED

## 2023-03-02 LAB — MONONUCLEOSIS SCREEN: Mono Screen: NEGATIVE

## 2023-03-02 MED ORDER — ONDANSETRON 4 MG PO TBDP
4.0000 mg | ORAL_TABLET | Freq: Once | ORAL | Status: DC
  Filled 2023-03-02: qty 1

## 2023-03-02 MED ORDER — DEXAMETHASONE SODIUM PHOSPHATE 10 MG/ML IJ SOLN
10.0000 mg | Freq: Once | INTRAMUSCULAR | Status: AC
  Administered 2023-03-02: 10 mg via INTRAMUSCULAR
  Filled 2023-03-02: qty 1

## 2023-03-02 NOTE — ED Notes (Signed)
Patient given sandwich and water and tolerated both with no nausea or vomiting.

## 2023-03-02 NOTE — Discharge Instructions (Addendum)
Your mononucleosis, COVID-19 and strep testing was negative.  Recommend you finish out your course of antibiotics.  You were administered a steroid.  Recommend you continue to push oral fluids to maintain hydration, return for any severe worsening neck swelling or pain, return for inability to swallow or tolerate oral fluids as this can result in dehydration, take Tylenol and ibuprofen for pain control and fever control.  Follow-up with your PCP to ensure resolution

## 2023-03-02 NOTE — ED Provider Notes (Signed)
[place] EMERGENCY DEPARTMENT AT [facility] Provider Note   CSN: [id number] Arrival date & time: 03/02/23  1041     History  Chief Complaint  Patient presents with   Sore Throat   Emesis    Tiffany Aguilar is a 22 y.o. female.   Sore Throat  Emesis Associated symptoms: sore throat      22 year old female presenting to the emergency department with a sore throat for the past 2 days.  She endorses a bilateral sore throat with tonsillar swelling.  She endorses difficulty and pain with swallowing.  She denies any difficulty breathing.  She denies any chest pain or shortness of breath.  She had two episodes of nausea and vomiting earlier today but denies any current nausea.  She has had some diarrhea.  No abdominal pain.  She was recently started on amoxicillin after being seen in urgent care due to concern for possible strep throat.  She endorses generalized bodyaches.  No known fevers.  Endorses chills.  Urine pregnancy at urgent care on 5/13 was negative.  A rapid strep was negative and a culture was pending so the patient was started on amoxicillin for 10 days.  Patient was provided with Zofran for nausea and advised Tylenol.  She presents back to the emergency department due to concern for worsening pharyngitis.  Home Medications Prior to Admission medications   Medication Sig Start Date End Date Taking? Authorizing Provider  amoxicillin (AMOXIL) 400 MG/5ML suspension Take 6.3 mLs (500 mg total) by mouth 2 (two) times daily for 10 days. 03/01/23 03/11/23  Rising, Rebecca, PA-C  ondansetron (ZOFRAN-ODT) 4 MG disintegrating tablet Take 1 tablet (4 mg total) by mouth 2 (two) times daily for 4 days. Dissolve 1 tablet under the tongue 30 minutes before antibiotic use 03/01/23 03/05/23  Rising, Lurena Joiner, PA-C      Allergies    Blueberry [vaccinium angustifolium] and Kiwi extract    Review of Systems   Review of Systems  HENT:  Positive for sore throat.   Gastrointestinal:   Positive for vomiting.  All other systems reviewed and are negative.   Physical Exam Updated Vital Signs BP 107/71 (BP Location: Right Arm)   Pulse 100   Temp 97.8 F (36.6 C) (Oral)   Resp 17   Ht 5\' 3"  (1.6 m)   Wt 53.1 kg   LMP 01/18/2023   SpO2 100%   BMI 20.74 kg/m  Physical Exam Vitals and nursing note reviewed.  Constitutional:      General: She is not in acute distress.    Appearance: She is well-developed.  HENT:     Head: Normocephalic and atraumatic.     Mouth/Throat:     Pharynx: Posterior oropharyngeal erythema present.     Tonsils: Tonsillar exudate present. No tonsillar abscesses. 2+ on the right. 2+ on the left.  Eyes:     Conjunctiva/sclera: Conjunctivae normal.  Cardiovascular:     Rate and Rhythm: Normal rate and regular rhythm.  Pulmonary:     Effort: Pulmonary effort is normal. No respiratory distress.     Breath sounds: Normal breath sounds.  Abdominal:     Palpations: Abdomen is soft.     Tenderness: There is no abdominal tenderness.  Musculoskeletal:        General: No swelling.     Cervical back: Neck supple.  Skin:    General: Skin is warm and dry.     Capillary Refill: Capillary refill takes less than 2 seconds.  Neurological:  Mental Status: She is alert.  Psychiatric:        Mood and Affect: Mood normal.     ED Results / Procedures / Treatments   Labs (all labs ordered are listed, but only abnormal results are displayed) Labs Reviewed  SARS CORONAVIRUS 2 BY RT PCR  GROUP A STREP BY PCR  MONONUCLEOSIS SCREEN    EKG None  Radiology No results found.  Procedures Procedures    Medications Ordered in ED Medications  dexamethasone (DECADRON) injection 10 mg (10 mg Intramuscular Given 03/02/23 1243)    ED Course/ Medical Decision Making/ A&P                             Medical Decision Making Amount and/or Complexity of Data Reviewed Labs: ordered.  Risk Prescription drug management.    22 year old female  presenting to the emergency department with a sore throat for the past 2 days.  She endorses a bilateral sore throat with tonsillar swelling.  She endorses difficulty and pain with swallowing.  She denies any difficulty breathing.  She denies any chest pain or shortness of breath.  She had two episodes of nausea and vomiting earlier today but denies any current nausea.  She has had some diarrhea.  No abdominal pain.  She was recently started on amoxicillin after being seen in urgent care due to concern for possible strep throat.  She endorses generalized bodyaches.  No known fevers.  Endorses chills.  Urine pregnancy at urgent care on 5/13 was negative.  A rapid strep was negative and a culture was pending so the patient was started on amoxicillin for 10 days.  Patient was provided with Zofran for nausea and advised Tylenol.  She presents back to the emergency department due to concern for worsening pharyngitis.  On arrival, the patient was vitally stable, afebrile, not tachycardic or tachypneic, hemodynamically stable.  Physical exam significant for a bilateral pharyngitis with tonsillar exudate.  No unilateral component to suggest peritonsillar abscess and none appreciated on exam.  No tongue elevation to suggest Ludwig's Angina or tenderness submandibularly.  Good range of motion of the neck with low concern for RPA.  Patient presenting with a nonspecific pharyngitis.  She is currently on amoxicillin however was only able to get the medicine yesterday and has not fully started it.  Presenting with worsening pain and swelling of the throat with a pharyngitis seen on exam.  Repeat COVID-19 and influenza PCR testing was collected and resulted negative, mononucleosis screen also resulted negative, group A strep PCR was collected and is negative.  A culture had previously been collected and is still pending reintubation at this time, not fully resulted yet.  Given the worsening pharyngitis, the patient was  administered IM Decadron.  Following this, the patient endorsed significant symptomatic improvement and was able to tolerate oral intake.  She felt symptomatically improved.  I recommended the patient continue to complete her course of amoxicillin as previously prescribed, follow-up on her previous culture results.  Stable for discharge with return precautions provided.   Final Clinical Impression(s) / ED Diagnoses Final diagnoses:  Pharyngitis, unspecified etiology    Rx / DC Orders ED Discharge Orders     None         Ernie Avena, MD 03/03/23 0710

## 2023-03-02 NOTE — ED Triage Notes (Signed)
Pt c/o N/V, sore throatx2d. Pt states her tonsils are now having int swelling. Pt c/o dysphagia. Pt states she vomited 2 times in last 24 hrs. Pt c/o diarrhea every hour

## 2023-03-03 LAB — CULTURE, GROUP A STREP (THRC)

## 2023-03-12 ENCOUNTER — Encounter (HOSPITAL_COMMUNITY): Payer: Self-pay | Admitting: Emergency Medicine

## 2023-03-12 ENCOUNTER — Ambulatory Visit (INDEPENDENT_AMBULATORY_CARE_PROVIDER_SITE_OTHER): Payer: Medicaid Other

## 2023-03-12 ENCOUNTER — Ambulatory Visit (HOSPITAL_COMMUNITY)
Admission: EM | Admit: 2023-03-12 | Discharge: 2023-03-12 | Disposition: A | Discharge status: Home / Self Care | Payer: Medicaid Other

## 2023-03-12 ENCOUNTER — Other Ambulatory Visit: Payer: Self-pay

## 2023-03-12 DIAGNOSIS — R519 Headache, unspecified: Secondary | ICD-10-CM | POA: Diagnosis not present

## 2023-03-12 DIAGNOSIS — M25522 Pain in left elbow: Secondary | ICD-10-CM | POA: Diagnosis not present

## 2023-03-12 DIAGNOSIS — S0990XA Unspecified injury of head, initial encounter: Secondary | ICD-10-CM

## 2023-03-12 DIAGNOSIS — S0083XA Contusion of other part of head, initial encounter: Secondary | ICD-10-CM | POA: Diagnosis not present

## 2023-03-12 MED ORDER — METHOCARBAMOL 500 MG PO TABS: 500.0000 mg | ORAL_TABLET | Freq: Two times a day (BID) | ORAL | 0 refills | Status: DC

## 2023-03-12 MED ORDER — KETOROLAC TROMETHAMINE 30 MG/ML IJ SOLN
INTRAMUSCULAR | Status: AC
  Filled 2023-03-12: qty 1

## 2023-03-12 MED ORDER — ACETAMINOPHEN 325 MG PO TABS
ORAL_TABLET | ORAL | Status: AC
  Filled 2023-03-12: qty 3

## 2023-03-12 MED ORDER — ACETAMINOPHEN 325 MG PO TABS
975.0000 mg | ORAL_TABLET | Freq: Once | ORAL | Status: AC
  Administered 2023-03-12: 975 mg via ORAL

## 2023-03-12 MED ORDER — KETOROLAC TROMETHAMINE 30 MG/ML IJ SOLN
30.0000 mg | Freq: Once | INTRAMUSCULAR | Status: AC
  Administered 2023-03-12: 30 mg via INTRAMUSCULAR
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
## 2023-05-23 ENCOUNTER — Emergency Department (HOSPITAL_COMMUNITY)
Admission: EM | Admit: 2023-05-23 | Discharge: 2023-05-23 | Disposition: A | Discharge status: Home / Self Care | Payer: MEDICAID | Attending: Emergency Medicine | Admitting: Emergency Medicine

## 2023-05-23 ENCOUNTER — Other Ambulatory Visit: Payer: Self-pay

## 2023-05-23 DIAGNOSIS — J039 Acute tonsillitis, unspecified: Secondary | ICD-10-CM | POA: Diagnosis not present

## 2023-05-23 DIAGNOSIS — J029 Acute pharyngitis, unspecified: Secondary | ICD-10-CM | POA: Diagnosis present

## 2023-05-23 MED ORDER — IBUPROFEN 800 MG PO TABS
800.0000 mg | ORAL_TABLET | Freq: Once | ORAL | Status: AC
  Administered 2023-05-23: 800 mg via ORAL
  Filled 2023-05-23: qty 1

## 2023-05-23 MED ORDER — LIDOCAINE VISCOUS HCL 2 % MT SOLN: 15.0000 mL | OROMUCOSAL | 0 refills | Status: DC | PRN

## 2023-05-23 MED ORDER — DEXAMETHASONE 4 MG PO TABS
6.0000 mg | ORAL_TABLET | Freq: Once | ORAL | Status: AC
  Administered 2023-05-23: 6 mg via ORAL
  Filled 2023-05-23: qty 2

## 2023-05-23 MED ORDER — DEXAMETHASONE 6 MG PO TABS: 6.0000 mg | ORAL_TABLET | Freq: Once | ORAL | 0 refills | Status: AC

## 2023-05-23 MED ORDER — CEPACOL REGULAR STRENGTH 3 MG MT LOZG: 1.0000 | LOZENGE | OROMUCOSAL | 12 refills | Status: DC | PRN

## 2023-05-23 MED ORDER — LIDOCAINE VISCOUS HCL 2 % MT SOLN
15.0000 mL | Freq: Once | OROMUCOSAL | Status: AC
  Administered 2023-05-23: 15 mL via OROMUCOSAL
  Filled 2023-05-23: qty 15

## 2023-05-23 NOTE — ED Triage Notes (Signed)
Pt/mother stated, I've had swollen tonsils since May off and on. I've gone to several facilities and they have ran every kink of test and everything is neg. They said if  we want  more extensive test she will have to be admitted to ICU. If I need that its ok.

## 2023-05-23 NOTE — Discharge Instructions (Signed)
You were seen in the emergency department for your sore throat.  Her tonsils are swollen but you have no signs of abscess or other infection that needed antibiotics.  I have given you some steroids that should help with the pain and swelling and you can take a second dose in 24 to 48 hours.  You can also take ibuprofen up to every 6 hours, use lidocaine solution or throat lozenges to help with the pain.  You should return to the emergency department if you are having fevers, you are unable to open your mouth, you are unable to swallow your own saliva or if you have any other new or concerning symptoms.

## 2023-05-23 NOTE — ED Provider Notes (Signed)
[place] [facility] AT [facility] Provider Note   CSN: [id number] Arrival date & time: 05/23/23  4098     History  Chief Complaint  Patient presents with   Sore Throat    Tiffany Aguilar is a 22 y.o. female.  Patient is a 22 year old female with no significant past medical history presenting to the emergency department for sore throat.  The patient states that she has had a sore throat for about the last week.  She states that her tonsils feel swollen.  She states that she has a mild cough and some bodyaches.  She denies any known fevers.  She states that she has pain with swallowing but is still able to swallow liquids and her own saliva.  She states that she has had some nausea but denies any vomiting or diarrhea.  She states that she has had on and off throat swelling for the last several months.  She states that she was seen at an outside ED few days ago and tested negative for COVID, flu, strep and mono.  She states that she was told that if she "needed more viral testing would have to be admitted to the ICU but is not severe enough for that at this time".  The history is provided by the patient and a parent.  Sore Throat       Home Medications Prior to Admission medications   Medication Sig Start Date End Date Taking? Authorizing Provider  dexamethasone (DECADRON) 6 MG tablet Take 1 tablet (6 mg total) by mouth once for 1 dose. Take in 24-48 hours from your first dose you received in the ER 05/23/23 05/23/23 Yes Theresia Lo, [place] K, DO  lidocaine (XYLOCAINE) 2 % solution Use as directed 15 mLs in the mouth or throat as needed for mouth pain. 05/23/23  Yes Theresia Lo, [place] K, DO  menthol-cetylpyridinium (CEPACOL REGULAR STRENGTH) 3 MG lozenge Take 1 lozenge (3 mg total) by mouth as needed for sore throat. 05/23/23  Yes Theresia Lo, Benetta Spar K, DO  AMOXICILLIN PO Take by mouth.    [provider]  methocarbamol (ROBAXIN) 500 MG tablet Take 1 tablet (500 mg  total) by mouth 2 (two) times daily. 03/12/23   Carlisle Beers, FNP      Allergies    Blueberry [vaccinium angustifolium] and Kiwi extract    Review of Systems   Review of Systems  Physical Exam Updated Vital Signs BP 111/75 (BP Location: Left Arm)   Pulse 86   Temp 98.4 F (36.9 C) (Oral)   Resp 12   Ht 5\' 3"  (1.6 m)   Wt 54.4 kg   SpO2 100%   BMI 21.26 kg/m  Physical Exam Vitals and nursing note reviewed.  Constitutional:      General: She is not in acute distress.    Appearance: She is well-developed.  HENT:     Head: Normocephalic and atraumatic.     Nose: No congestion.     Mouth/Throat:     Mouth: Mucous membranes are moist.     Pharynx: Uvula midline. No oropharyngeal exudate, posterior oropharyngeal erythema or uvula swelling.     Tonsils: No tonsillar exudate or tonsillar abscesses. 2+ on the right. 2+ on the left.     Comments: Normal phonation, no trismus, tolerating secretions Eyes:     Conjunctiva/sclera: Conjunctivae normal.  Cardiovascular:     Rate and Rhythm: Normal rate and regular rhythm.     Heart sounds: Normal heart sounds.  Pulmonary:  Effort: Pulmonary effort is normal.     Breath sounds: Normal breath sounds.  Abdominal:     Palpations: Abdomen is soft.  Musculoskeletal:     Cervical back: Normal range of motion and neck supple.  Lymphadenopathy:     Cervical: Cervical adenopathy present.  Skin:    General: Skin is warm and dry.  Neurological:     General: No focal deficit present.     Mental Status: She is alert and oriented to person, place, and time.  Psychiatric:        Mood and Affect: Mood normal.        Behavior: Behavior normal.     ED Results / Procedures / Treatments   Labs (all labs ordered are listed, but only abnormal results are displayed) Labs Reviewed - No data to display  EKG None  Radiology No results found.  Procedures Procedures    Medications Ordered in ED Medications  dexamethasone  (DECADRON) tablet 6 mg (6 mg Oral Given 05/23/23 0929)  ibuprofen (ADVIL) tablet 800 mg (800 mg Oral Given 05/23/23 0929)  lidocaine (XYLOCAINE) 2 % viscous mouth solution 15 mL (15 mLs Mouth/Throat Given by Other 05/23/23 4010)    ED Course/ Medical Decision Making/ A&P                                 Medical Decision Making This patient presents to the ED with chief complaint(s) of sore throat with no pertinent past medical history which further complicates the presenting complaint. The complaint involves an extensive differential diagnosis and also carries with it a high risk of complications and morbidity.    The differential diagnosis includes viral pharyngitis, recent negative strep testing and mono testing making strep or mono less likely, no evidence of PTA, RPA or Ludwig's on exam  Additional history obtained: Additional history obtained from family Records reviewed Care Everywhere/External Records  ED Course and Reassessment: On patient's arrival to the emergency department she is hemodynamically stable in no acute distress.  She does have some tonsillar hypertrophy but no evidence of deep space infection and is nontoxic-appearing.  Patient will be given symptomatic treatment with steroids, viscous lidocaine and ibuprofen.  She will be given outpatient ENT follow-up and was given strict return precautions.  Independent labs interpretation:  N/A  Independent visualization of imaging: - N/A  Consultation: - Consulted or discussed management/test interpretation w/ external professional: N/A  Consideration for admission or further workup: Patient has no emergent conditions requiring admission or further work-up at this time and is stable for discharge home with ENT follow-up  Social Determinants of health: N/A    Risk Prescription drug management.          Final Clinical Impression(s) / ED Diagnoses Final diagnoses:  Tonsillitis    Rx / DC Orders ED Discharge  Orders          Ordered    dexamethasone (DECADRON) 6 MG tablet   Once        05/23/23 0938    lidocaine (XYLOCAINE) 2 % solution  As needed        05/23/23 0938    menthol-cetylpyridinium (CEPACOL REGULAR STRENGTH) 3 MG lozenge  As needed        05/23/23 2725              Rexford Maus, DO 05/23/23 [phone]

## 2023-06-07 ENCOUNTER — Ambulatory Visit (HOSPITAL_COMMUNITY)
Admission: EM | Admit: 2023-06-07 | Discharge: 2023-06-07 | Disposition: A | Discharge status: Home / Self Care | Payer: MEDICAID | Attending: Internal Medicine | Admitting: Internal Medicine

## 2023-06-07 ENCOUNTER — Encounter (HOSPITAL_COMMUNITY): Payer: Self-pay

## 2023-06-07 ENCOUNTER — Inpatient Hospital Stay (HOSPITAL_COMMUNITY): Admission: RE | Admit: 2023-06-07 | Payer: MEDICAID | Source: Ambulatory Visit

## 2023-06-07 DIAGNOSIS — N898 Other specified noninflammatory disorders of vagina: Secondary | ICD-10-CM

## 2023-06-07 LAB — POCT URINALYSIS DIP (MANUAL ENTRY)
Bilirubin, UA: NEGATIVE
Blood, UA: NEGATIVE
Glucose, UA: NEGATIVE mg/dL
Ketones, POC UA: NEGATIVE mg/dL
Leukocytes, UA: NEGATIVE
Nitrite, UA: NEGATIVE
Protein Ur, POC: NEGATIVE mg/dL
Spec Grav, UA: 1.015 (ref 1.010–1.025)
Urobilinogen, UA: 0.2 E.U./dL
pH, UA: 7.5 (ref 5.0–8.0)

## 2023-06-07 LAB — POCT URINE PREGNANCY: Preg Test, Ur: NEGATIVE

## 2023-06-07 NOTE — ED Triage Notes (Addendum)
Patient reports a watery white vaginal discharge x 2 days. Patient states discharge has a foul odor. Patient reports urinary frequency, but denies dysuri

## 2023-06-07 NOTE — ED Provider Notes (Signed)
[facility]    CSN: [id number] Arrival date & time: 06/07/23  1019      History   Chief Complaint Chief Complaint  Patient presents with   Vaginal Discharge    HPI Tiffany Aguilar is a 22 y.o. female.   Patient here today for evaluation of vaginal discharge.  She reports that discharge is watery and has a foul odor.  She denies any genital lesions or rashes.  She has not had any new partners.  She has tried using vinegar baths without improvement.  She does report urinary frequency but denies dysuria.  The history is provided by the patient.  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      Home Medications    Prior to Admission medications   Medication Sig Start Date End Date Taking? Authorizing Provider  lidocaine (XYLOCAINE) 2 % solution Use as directed 15 mLs in the mouth or throat as needed for mouth pain. 05/23/23   Rexford Maus, DO  menthol-cetylpyridinium (CEPACOL REGULAR STRENGTH) 3 MG lozenge Take 1 lozenge (3 mg total) by mouth as needed for sore throat. 05/23/23   Rexford Maus, DO    Family History Family History  Problem Relation Age of Onset   Hypertension Maternal Grandfather     Social History Social History   Tobacco Use   Smoking status: Never   Smokeless tobacco: Never  Vaping Use   Vaping status: Never Used  Substance Use Topics   Alcohol use: Not Currently    Comment: occasionally    Drug use: Never     Allergies   Blueberry [vaccinium angustifolium] and Kiwi extract   Review of Systems Review of Systems  Constitutional:  Negative for chills and fever.  Eyes:  Negative for discharge and redness.  Gastrointestinal:  Negative for abdominal pain, nausea and vomiting.  Genitourinary:  Positive for frequency and vaginal discharge. Negative for dysuria and genital sores.     Physical Exam Triage Vital Signs ED Triage Vitals  Encounter Vitals Group     BP 06/07/23 1047 106/70     Systolic BP Percentile --      Diastolic BP Percentile --      Pulse Rate 06/07/23 1047 78     Resp 06/07/23 1047 14     Temp 06/07/23 1047 97.7 F (36.5 C)     Temp Source 06/07/23 1047 Oral     SpO2 06/07/23 1047 98 %     Weight --      Height --      Head Circumference --      Peak Flow --      Pain Score 06/07/23 1049 0     Pain Loc --      Pain Education --      Exclude from Growth Chart --    No data found.  Updated Vital  Signs BP 106/70 (BP Location: Right Arm)   Pulse 78   Temp 97.7 F (36.5 C) (Oral)   Resp 14   LMP 05/09/2023 Comment: Irregular periods  SpO2 98%     Physical Exam Vitals and nursing note reviewed.  Constitutional:      General: She is not in acute distress.    Appearance: Normal appearance. She is not ill-appearing.  HENT:     Head: Normocephalic and atraumatic.  Eyes:     Conjunctiva/sclera: Conjunctivae normal.  Cardiovascular:     Rate and Rhythm: Normal rate.  Pulmonary:     Effort: Pulmonary effort is normal. No respiratory distress.  Neurological:     Mental Status: She is alert.  Psychiatric:        Mood and Affect: Mood normal.        Behavior: Behavior normal.        Thought Content: Thought content normal.      UC Treatments / Results  Labs (all labs ordered are listed, but only abnormal results are displayed) Labs Reviewed  POCT URINALYSIS DIP (MANUAL ENTRY)  POCT URINE PREGNANCY  CERVICOVAGINAL ANCILLARY ONLY     EKG   Radiology No results found.  Procedures Procedures (including critical care time)  Medications Ordered in UC Medications - No data to display  Initial Impression / Assessment and Plan / UC Course  I have reviewed the triage vital signs and the nursing notes.  Pertinent labs & imaging results that were available during my care of the patient were reviewed by me and considered in my medical decision making (see chart for details).    STD screening ordered.  Will also screen for BV and yeast.  Will await results for further recommendation.  Encouraged follow-up with any further concerns.  Final Clinical Impressions(s) / UC Diagnoses   Final diagnoses:  Vaginal discharge   Discharge Instructions   None    ED Prescriptions   None    PDMP not reviewed this encounter.   Tomi Bamberger, PA-C 06/07/23 1318

## 2023-06-08 ENCOUNTER — Telehealth (HOSPITAL_COMMUNITY): Payer: Self-pay | Admitting: Physician Assistant

## 2023-06-08 ENCOUNTER — Telehealth: Payer: MEDICAID | Admitting: Family Medicine

## 2023-06-08 DIAGNOSIS — N939 Abnormal uterine and vaginal bleeding, unspecified: Secondary | ICD-10-CM

## 2023-06-08 DIAGNOSIS — R112 Nausea with vomiting, unspecified: Secondary | ICD-10-CM

## 2023-06-08 DIAGNOSIS — Z7251 High risk heterosexual behavior: Secondary | ICD-10-CM

## 2023-06-08 LAB — CERVICOVAGINAL ANCILLARY ONLY
Bacterial Vaginitis (gardnerella): POSITIVE — AB
Candida Glabrata: NEGATIVE
Candida Vaginitis: POSITIVE — AB
Chlamydia: NEGATIVE
Comment: NEGATIVE
Comment: NEGATIVE
Comment: NEGATIVE
Comment: NEGATIVE
Comment: NEGATIVE
Comment: NORMAL
Neisseria Gonorrhea: NEGATIVE
Trichomonas: POSITIVE — AB

## 2023-06-08 MED ORDER — FLUCONAZOLE 150 MG PO TABS: 150.0000 mg | ORAL_TABLET | ORAL | 0 refills | Status: DC

## 2023-06-08 MED ORDER — METRONIDAZOLE 500 MG PO TABS: 500.0000 mg | ORAL_TABLET | Freq: Two times a day (BID) | ORAL | 0 refills | Status: DC

## 2023-06-08 NOTE — Progress Notes (Signed)
Because irregular bleeding with symptoms of a pregnancy- vs cycle. After having unprotected sex. I feel your condition warrants further evaluation and I recommend that you be seen in a face to face visit.   NOTE: There will be NO CHARGE for this eVisit   If you are having a true medical emergency please call 911.

## 2023-06-08 NOTE — Telephone Encounter (Signed)
Patient was seen 06/07/2023 and had STI swab that was positive for trichomoniasis, BV, yeast.  She saw her results on MyChart and contacted our clinic requesting treatment be sent in.  Metronidazole and Diflucan sent to pharmacy.  She is to avoid alcohol while on metronidazole due to risk of vomiting.  Medication sent to [facility].

## 2023-06-25 ENCOUNTER — Institutional Professional Consult (permissible substitution) (INDEPENDENT_AMBULATORY_CARE_PROVIDER_SITE_OTHER): Payer: MEDICAID | Admitting: Otolaryngology

## 2023-06-29 ENCOUNTER — Telehealth: Payer: MEDICAID | Admitting: Physician Assistant

## 2023-06-29 DIAGNOSIS — N926 Irregular menstruation, unspecified: Secondary | ICD-10-CM

## 2023-06-29 NOTE — Progress Notes (Signed)
Because this is not something we can adequately assess via e-visit as you need examination and testing, I feel your condition warrants further evaluation and I recommend that you be seen in a face to face visit.   NOTE: There will be NO CHARGE for this eVisit   If you are having a true medical emergency please call 911.      For an urgent face to face visit, [place] has eight urgent care centers for your convenience:   NEW!! [facility] Health Urgent Care Center at [facility] Get Driving Directions [phone] [address], [address] [place], [address]    [facility] Health Urgent Care Center at [facility] Get Driving Directions [phone] [address] [place], [place] [address]   [facility] Health Urgent Care Center [facility]) Get Driving Directions [phone] [address] [place], [place] [address]  [facility] Health Urgent Care Center [facility]) Get Driving Directions [phone] [address] [place],  [place]  [address]  [facility] Health Urgent Care Center [facility] - at [employer]  [phone] [address] [place],  [place] [address]   [facility] Health Urgent Care at [facility] Get Driving Directions [phone] [address] [place], [place] [address]   [facility] Health Urgent Care at [facility] Get Driving Directions  [phone] [address] [place], [place] [address]   [facility] Health Urgent Care at [facility] Directions [phone] [address], [address] [place], [place] [address]  Your MyChart E-visit questionnaire answers were reviewed by a board certified advanced clinical practitioner to complete your personal care plan based on your specific symptoms.  Thank you for using e-Visits.

## 2023-06-30 ENCOUNTER — Inpatient Hospital Stay (HOSPITAL_COMMUNITY)
Admission: AD | Admit: 2023-06-30 | Discharge: 2023-06-30 | Disposition: A | Discharge status: Home / Self Care | Payer: MEDICAID | Attending: Family Medicine | Admitting: Family Medicine

## 2023-06-30 DIAGNOSIS — Z3202 Encounter for pregnancy test, result negative: Secondary | ICD-10-CM | POA: Diagnosis present

## 2023-06-30 DIAGNOSIS — N939 Abnormal uterine and vaginal bleeding, unspecified: Secondary | ICD-10-CM | POA: Insufficient documentation

## 2023-06-30 DIAGNOSIS — R102 Pelvic and perineal pain: Secondary | ICD-10-CM | POA: Diagnosis present

## 2023-06-30 DIAGNOSIS — Z920 Personal history of contraception: Secondary | ICD-10-CM | POA: Insufficient documentation

## 2023-06-30 DIAGNOSIS — N938 Other specified abnormal uterine and vaginal bleeding: Secondary | ICD-10-CM | POA: Diagnosis not present

## 2023-06-30 NOTE — MAU Note (Signed)
.  Tiffany Aguilar is a 22 y.o. at Unknown here in MAU reporting: has had irregular vag  bleeding 8/16. Took 2 HPT both were negative but having some cramping and nausea. Had been on depo but has stopped. Desires to get pregnant.  LMP: 06/04/23? Onset of complaint: 06/04/23 Pain score: 1 Vitals:   06/30/23 1029  BP: 117/70  Pulse: 88  Resp: 18  Temp: 98.2 F (36.8 C)     FHT:n/a Lab orders placed from triage:  UPT

## 2023-06-30 NOTE — MAU Provider Note (Signed)
Event Date/Time   First Provider Initiated Contact with Patient 06/30/23 1100      S Ms. Tiffany Aguilar is a 22 y.o. patient who presents to MAU today with complaint of irregular vaginal bleeding. States she recently stopped using Depo (last shot 04/14/23) and has had irregular vaginal bleeding since then. She had a negative pregnancy test at urgent care on 8/16 and 8/29. Reports irregular lighter vaginal bleeding (lighter than her typical periods) since then. She is concerned that she has had ongoing spotting and lighter bleeding. No lightheadedness or dizziness.  O BP 117/70   Pulse 88   Temp 98.2 F (36.8 C)   Resp 18   Ht 5\' 3"  (1.6 m)   Wt 55.3 kg   LMP 06/04/2023 Comment: Irregular periods  BMI 21.61 kg/m  Physical Exam Constitutional:      General: She is not in acute distress.    Appearance: Normal appearance. She is not ill-appearing.  Cardiovascular:     Rate and Rhythm: Normal rate.  Pulmonary:     Effort: Pulmonary effort is normal. No respiratory distress.  Abdominal:     General: Abdomen is flat. There is no distension.  Musculoskeletal:        General: Normal range of motion.  Neurological:     General: No focal deficit present.     Mental Status: She is alert and oriented to person, place, and time.     A/P:  Medical screening exam complete  Patient with irregular vaginal bleeding likely in setting of recent discontinuation of Depo injections  UPT negative here and x2 previously  rec she consider outpatient evaluation/f/up -- given info for [facility] -- offered her transfer to ER if she feels she needs further eval  Patient may return to MAU as needed   Sundra Aland, MD 06/30/2023 11:06 AM

## 2023-07-01 LAB — POCT PREGNANCY, URINE: Preg Test, Ur: NEGATIVE

## 2023-07-06 ENCOUNTER — Telehealth: Payer: MEDICAID | Admitting: Physician Assistant

## 2023-07-06 DIAGNOSIS — S90219A Contusion of unspecified great toe with damage to nail, initial encounter: Secondary | ICD-10-CM

## 2023-07-06 NOTE — Progress Notes (Signed)
Because we need a detailed examination to determine if there is trapped blood under the nail or another lesion of the skin under the nail mimicking a bruise, I feel your condition warrants further evaluation and I recommend that you be seen in a face to face visit.   NOTE: There will be NO CHARGE for this eVisit   If you are having a true medical emergency please call 911.      For an urgent face to face visit, [place] has eight urgent care centers for your convenience:   NEW!! [facility] Health Urgent Care Center at [facility] Get Driving Directions [phone] [address] [place], [address]    [facility] Health Urgent Care Center at [facility] Get Driving Directions [phone] [address] [place], [place] [address]   [facility] Health Urgent Care Center [facility]) Get Driving Directions [phone] [address] [place], [place] [address]  [facility] Health Urgent Care Center [facility]) Get Driving Directions [phone] [address] [place],  [place]  [address]  [facility] Health Urgent Care Center [facility] - at [employer]  [phone] [address] [place],  [place] [address]   [facility] Health Urgent Care at [facility] Get Driving Directions [phone] [place], [place] [address]   [facility] Health Urgent Care at [facility] Get Driving Directions  [phone] [address] [place], [place] [address]   [facility] Health Urgent Care at [facility] Directions [phone] [address], [address] [place], [place] [address]  Your MyChart E-visit questionnaire answers were reviewed by a board certified advanced clinical practitioner to complete your personal care plan based on your specific symptoms.  Thank you for using e-Visits.

## 2023-08-23 ENCOUNTER — Ambulatory Visit (HOSPITAL_COMMUNITY): Payer: MEDICAID

## 2023-08-23 ENCOUNTER — Ambulatory Visit (HOSPITAL_COMMUNITY)
Admission: EM | Admit: 2023-08-23 | Discharge: 2023-08-23 | Disposition: A | Discharge status: Home / Self Care | Payer: MEDICAID | Attending: Family Medicine | Admitting: Family Medicine

## 2023-08-23 ENCOUNTER — Encounter (HOSPITAL_COMMUNITY): Payer: Self-pay

## 2023-08-23 DIAGNOSIS — M25532 Pain in left wrist: Secondary | ICD-10-CM

## 2023-08-23 DIAGNOSIS — M79632 Pain in left forearm: Secondary | ICD-10-CM

## 2023-08-23 NOTE — ED Triage Notes (Signed)
Here for right wrist pain and left toe pain x 2 days. Pt was horse playing around over the weekend and fall off the bed. No LOC. Pt did not hit her head.

## 2023-08-23 NOTE — ED Provider Notes (Signed)
[facility]    CSN: [id number] Arrival date & time: 08/23/23  3244      History   Chief Complaint Chief Complaint  Patient presents with   Wrist Pain   Toe Pain    HPI Tiffany Aguilar is a 22 y.o. female.   Patient is presenting with a 3-day history of left arm and right third toe pain.  Patient states that she was messing around on the bed with a friend and then pushed off and fell backwards onto her left arm.  Patient has tenderness over the dorsal aspect of her left arm about two thirds of the way distally.  Patient notes that the pain in the arm radiates all the way up however the most pain is at the distal portion.  Patient has not been taking anything for pain.  Patient rates the pain a 9 out of 10 at the distal aspect.  Patient is able to move her fingers and open and close her hand without any difficulty.  Patient is able to flex and extend the wrist without any difficulty.  Patient's third toe does have some swelling as well as minor trauma noted.  No abnormal angulation noted.  There are some mild tenderness over the dorsal side, no tenderness over the plantar side.   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      Home Medications    Prior to Admission medications   Not on File    Family History Family History  Problem Relation Age of Onset   Hypertension Maternal Grandfather     Social History Social History   Tobacco Use   Smoking status: Never   Smokeless tobacco: Never  Vaping Use    Vaping status: Never Used  Substance Use Topics   Alcohol use: Not Currently    Comment: occasionally   Drug use: Never     Allergies   Blueberry [vaccinium angustifolium] and Kiwi extract   Review of Systems Review of Systems   Physical Exam Triage Vital Signs ED Triage Vitals  Encounter Vitals Group     BP 08/23/23 0948 110/72     Systolic BP Percentile --      Diastolic BP Percentile --      Pulse Rate 08/23/23 0948 100     Resp 08/23/23 0948 16     Temp 08/23/23 0948 98.4 F (36.9 C)     Temp Source 08/23/23 0948 Oral     SpO2 08/23/23 0948 98 %     Weight --      Height --      Head Circumference --      Peak Flow --      Pain Score 08/23/23 0950 7     Pain Loc --      Pain Education --      Exclude from Growth Chart --    No data found.  Updated Vital Signs BP 110/72 (BP  Location: Left Arm)   Pulse 100   Temp 98.4 F (36.9 C) (Oral)   Resp 16   SpO2 98%   Visual Acuity Right Eye Distance:   Left Eye Distance:   Bilateral Distance:    Right Eye Near:   Left Eye Near:    Bilateral Near:     Physical Exam Left arm: Inspection reveals no erythema, angulation, bony abnormality or swelling of the arm.  There is tenderness over the dorsal aspect of the ulna proximally to distally.  Most of the pain is about 3 inches proximal from the distal end of the ulna.  There is tenderness traveling all the way up to the proximal side.  Range of motion is full.  Strength is 5 out of 5 without noted pain.  Right foot: Inspection reveals erythema, bruising of the third toe, there is no noted angulation of the third toe.  There is some mild tenderness to palpation over the dorsal side, no tenderness over the plantar side.  Range of motion is full with extension and flexion.  UC Treatments / Results  Labs (all labs ordered are listed, but only abnormal results are displayed) Labs Reviewed - No data to display  EKG   Radiology No results  found.  Procedures Procedures (including critical care time)  Medications Ordered in UC Medications - No data to display  Initial Impression / Assessment and Plan / UC Course  I have reviewed the triage vital signs and the nursing notes.  Pertinent labs & imaging results that were available during my care of the patient were reviewed by me and considered in my medical decision making (see chart for details).     Given patient's mechanism of fall as well as point tenderness of the ulna, we will go ahead and do an x-ray of the forearm at this time.  Patient's toe pain is likely related to trauma and there is no concern for fracture at this time.  X-ray of the forearm shows no signs of any acute abnormalities.  Patient was advised to take Tylenol or Profen as needed for pain over the next coming days. Final Clinical Impressions(s) / UC Diagnoses   Final diagnoses:  Arthralgia of left forearm     Discharge Instructions      Please take Tylenol and ibuprofen as needed for your pain.  The pain should come down over the next few days and should feel better.     ED Prescriptions   None    PDMP not reviewed this encounter.   Brenton Grills, MD 08/23/23 1019

## 2023-08-23 NOTE — Discharge Instructions (Signed)
Please take Tylenol and ibuprofen as needed for your pain.  The pain should come down over the next few days and should feel better.

## 2023-10-22 ENCOUNTER — Ambulatory Visit (HOSPITAL_COMMUNITY)
Admission: EM | Admit: 2023-10-22 | Discharge: 2023-10-22 | Disposition: A | Discharge status: Home / Self Care | Payer: MEDICAID | Attending: Emergency Medicine | Admitting: Emergency Medicine

## 2023-10-22 ENCOUNTER — Encounter (HOSPITAL_COMMUNITY): Payer: Self-pay

## 2023-10-22 DIAGNOSIS — N898 Other specified noninflammatory disorders of vagina: Secondary | ICD-10-CM

## 2023-10-22 DIAGNOSIS — Z3202 Encounter for pregnancy test, result negative: Secondary | ICD-10-CM

## 2023-10-22 DIAGNOSIS — R21 Rash and other nonspecific skin eruption: Secondary | ICD-10-CM | POA: Diagnosis present

## 2023-10-22 DIAGNOSIS — N760 Acute vaginitis: Secondary | ICD-10-CM

## 2023-10-22 LAB — POCT URINE PREGNANCY: Preg Test, Ur: NEGATIVE

## 2023-10-22 MED ORDER — FLUCONAZOLE 150 MG PO TABS: 150.0000 mg | ORAL_TABLET | Freq: Every day | ORAL | 0 refills | Status: DC

## 2023-10-22 MED ORDER — CLOTRIMAZOLE 1 % EX CREA: TOPICAL_CREAM | CUTANEOUS | 0 refills | Status: DC

## 2023-10-22 NOTE — ED Triage Notes (Signed)
 Pt states vaginal itching for the past 3 weeks.  States she has had her period on and off  since Dec 1st.  States she gets Depo shots.  States she also has areas of dry skin on her butt that she has been using Eucerin on.

## 2023-10-22 NOTE — ED Provider Notes (Signed)
 [facility]    CSN: [id number] Arrival date & time: 10/22/23  1105      History   Chief Complaint Chief Complaint  Patient presents with   Vaginal Itching    HPI Tiffany Aguilar is a 23 y.o. female.   Patient presents to clinic complaining of vaginal itching that is been present for the past 3 weeks but gradually worsening.  Denies any to her vaginal discharge, reports discharges increased and white and creamy. Has not been sexually active since October. No abdominal pain, nausea, emesis or flank pain. No urinary symptoms.   Menstrual cycle has been intermittent since December.  Reports her Depo expired on Christmas.  She is having an itchy rash to her gluteal / groin area for the past few weeks. Small, dry circular areas. She has been using Eucerin and baby oil without much improvement. No history of eczema.   The history is provided by the patient and medical records.  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      Home Medications    Prior to Admission medications   Medication Sig Start Date End Date Taking? Authorizing Provider  clotrimazole  (LOTRIMIN ) 1 % cream Apply to affected area 2 times daily 10/22/23  Yes Hoke Baer  N, FNP  fluconazole  (DIFLUCAN ) 150 MG tablet Take 1 tablet (150 mg total) by mouth daily. 10/22/23  Yes Dreama, Yitzel Shasteen  N, FNP    Family History Family History  Problem Relation Age of Onset   Hypertension Maternal Grandfather     Social History Social History   Tobacco Use    Smoking status: Never   Smokeless tobacco: Never  Vaping Use   Vaping status: Never Used  Substance Use Topics   Alcohol use: Not Currently    Comment: occasionally   Drug use: Never     Allergies   Blueberry [vaccinium angustifolium] and Kiwi extract   Review of Systems Review of Systems  Per HPI   Physical Exam Triage Vital Signs ED Triage Vitals  Encounter Vitals Group     BP 10/22/23 1154 108/70     Systolic BP Percentile --      Diastolic BP Percentile --      Pulse Rate 10/22/23 1154 (!) 102     Resp 10/22/23 1154 16     Temp 10/22/23 1154 98 F (36.7 C)     Temp Source 10/22/23 1154 Oral     SpO2 10/22/23 1154 96 %     Weight --      Height --      Head Circumference --      Peak Flow --      Pain Score 10/22/23 1156 0     Pain Loc --      Pain Education --      Exclude from Growth Chart --    No data found.  Updated Vital Signs BP 108/70 (BP Location: Left Arm)   Pulse (!) 102  Temp 98 F (36.7 C) (Oral)   Resp 16   LMP 09/19/2023   SpO2 96%   Visual Acuity Right Eye Distance:   Left Eye Distance:   Bilateral Distance:    Right Eye Near:   Left Eye Near:    Bilateral Near:     Physical Exam Vitals and nursing note reviewed.  Constitutional:      Appearance: Normal appearance.  HENT:     Head: Normocephalic and atraumatic.     Right Ear: External ear normal.     Left Ear: External ear normal.     Nose: Nose normal.     Mouth/Throat:     Mouth: Mucous membranes are moist.  Eyes:     Conjunctiva/sclera: Conjunctivae normal.  Cardiovascular:     Rate and Rhythm: Normal rate.  Pulmonary:     Effort: Pulmonary effort is normal.  Musculoskeletal:        General: Normal range of motion.  Skin:    General: Skin is warm and dry.     Findings: Rash present.  Neurological:     General: No focal deficit present.     Mental Status: She is alert and oriented to person, place, and time.  Psychiatric:        Mood and Affect: Mood  normal.        Behavior: Behavior normal.      UC Treatments / Results  Labs (all labs ordered are listed, but only abnormal results are displayed) Labs Reviewed  POCT URINE PREGNANCY  CERVICOVAGINAL ANCILLARY ONLY    EKG   Radiology No results found.  Procedures Procedures (including critical care time)  Medications Ordered in UC Medications - No data to display  Initial Impression / Assessment and Plan / UC Course  I have reviewed the triage vital signs and the nursing notes.  Pertinent labs & imaging results that were available during my care of the patient were reviewed by me and considered in my medical decision making (see chart for details).  Vitals and triage reviewed, patient is hemodynamically stable.  Vaginal itching that is progressively worsened over the past 3 weeks with a white clumpy discharge, no odor.  Cytology swab obtained.  Will treat for yeast with Diflucan .  Circular scaly lesions to bilateral glutes and inner groin area, suspicious for tinea.  Encouraged Eucerin ointment, will add clotrimazole  to this.  Patient requested urine pregnancy, this was negative.  Staff will contact if additional follow-up is needed based on cytology.  Plan of care, follow-up care return precautions given, no questions at this time.     Final Clinical Impressions(s) / UC Diagnoses   Final diagnoses:  Rash and nonspecific skin eruption  Vaginal itching  Acute vaginitis  Urine pregnancy test negative     Discharge Instructions      Take the Diflucan  for vaginal itching.  You will be notified if any additional infections resolved on your vaginal swab and our staff will contact you to start the appropriate treatment.  You can also consider over-the-counter Monistat topically to help provide symptomatic relief of the vaginal itching.  Continue to use your unscented lotion.  I am adding clotrimazole , uses twice daily to the circular rash areas until healed.    Return to clinic for any new or urgent symptoms.      ED Prescriptions     Medication Sig Dispense Auth. Provider   fluconazole  (DIFLUCAN ) 150 MG tablet Take 1 tablet (150 mg total) by mouth daily. 1  tablet Dreama, Kevonna Nolte  N, FNP   clotrimazole  (LOTRIMIN ) 1 % cream Apply to affected area 2 times daily 28 g Dreama, Areta Terwilliger  N, FNP      PDMP not reviewed this encounter.   Dreama Alanna SAILOR, FNP 10/22/23 1256

## 2023-10-22 NOTE — Discharge Instructions (Addendum)
 Take the Diflucan  for vaginal itching.  You will be notified if any additional infections resolved on your vaginal swab and our staff will contact you to start the appropriate treatment.  You can also consider over-the-counter Monistat topically to help provide symptomatic relief of the vaginal itching.  Continue to use your unscented lotion.  I am adding clotrimazole , uses twice daily to the circular rash areas until healed.   Return to clinic for any new or urgent symptoms.

## 2023-10-25 LAB — CERVICOVAGINAL ANCILLARY ONLY
Bacterial Vaginitis (gardnerella): NEGATIVE
Candida Glabrata: NEGATIVE
Candida Vaginitis: POSITIVE — AB
Chlamydia: NEGATIVE
Comment: NEGATIVE
Comment: NEGATIVE
Comment: NEGATIVE
Comment: NEGATIVE
Comment: NEGATIVE
Comment: NORMAL
Neisseria Gonorrhea: NEGATIVE
Trichomonas: NEGATIVE

## 2023-12-16 ENCOUNTER — Telehealth: Payer: MEDICAID

## 2023-12-16 DIAGNOSIS — J0300 Acute streptococcal tonsillitis, unspecified: Secondary | ICD-10-CM | POA: Diagnosis not present

## 2023-12-17 MED ORDER — AMOXICILLIN 500 MG PO CAPS
500.0000 mg | ORAL_CAPSULE | Freq: Two times a day (BID) | ORAL | 0 refills | Status: AC
Start: 2023-12-17 — End: 2023-12-27

## 2023-12-17 NOTE — Progress Notes (Signed)

## 2023-12-19 ENCOUNTER — Ambulatory Visit (HOSPITAL_COMMUNITY)
Admission: EM | Admit: 2023-12-19 | Discharge: 2023-12-19 | Disposition: A | Discharge status: Home / Self Care | Payer: MEDICAID | Attending: Neurology | Admitting: Neurology

## 2023-12-19 ENCOUNTER — Encounter (HOSPITAL_COMMUNITY): Payer: Self-pay | Admitting: Emergency Medicine

## 2023-12-19 DIAGNOSIS — J029 Acute pharyngitis, unspecified: Secondary | ICD-10-CM | POA: Diagnosis not present

## 2023-12-19 LAB — POCT RAPID STREP A (OFFICE): Rapid Strep A Screen: NEGATIVE

## 2023-12-19 MED ORDER — ACETAMINOPHEN 500 MG PO TABS: 500.0000 mg | ORAL_TABLET | Freq: Four times a day (QID) | ORAL | 0 refills | Status: DC | PRN

## 2023-12-19 MED ORDER — PREDNISOLONE SODIUM PHOSPHATE 15 MG/5ML PO SOLN
ORAL | Status: AC
  Filled 2023-12-19: qty 2

## 2023-12-19 MED ORDER — LIDOCAINE VISCOUS HCL 2 % MT SOLN: 15.0000 mL | OROMUCOSAL | 0 refills | Status: DC | PRN

## 2023-12-19 MED ORDER — PREDNISOLONE SODIUM PHOSPHATE 15 MG/5ML PO SOLN
30.0000 mg | Freq: Once | ORAL | Status: AC
  Administered 2023-12-19: 30 mg via ORAL

## 2023-12-19 MED ORDER — PREDNISOLONE SODIUM PHOSPHATE 15 MG/5ML PO SOLN: 30.0000 mg | Freq: Every day | ORAL | Status: DC

## 2023-12-19 NOTE — Discharge Instructions (Addendum)
 Recommend not continuing antibiotics as strep testing was negative, continue using hot tea, vocal rest, Tylenol for pain.  Please follow-up with ENT on Monday as you have had numerous issues with your tonsils and it would be beneficial to have their opinion.  You may also follow-up with your primary care provider as needed.  Please return to urgent care for worsening symptoms and proceed to the ED emergency department with any red flag symptoms as discussed.

## 2023-12-19 NOTE — ED Provider Notes (Addendum)
 [facility]    CSN: [id number] Arrival date & time: 12/19/23  1007      History   Chief Complaint Chief Complaint  Patient presents with   Sore Throat   Neck Pain    HPI Tiffany Aguilar is a 23 y.o. female.   She did complete a televisit on 2/28 and was presumed to have strep throat, she says she came in here to check the diagnosis.  She was prescribed amoxicillin but she has not started this yet.  She was also previously seen in the emergency department thought to have tonsillitis.  She was referred to ENT but was unable to get in contact with them.  She is requesting their information today as well.  She states that she did have a fever on 2/28 at 101F.  She has had approximately 1 week of sore throat with neck pain.  She does endorse difficulty swallowing and eating due to this.  The history is provided by the patient.  Sore Throat This is a recurrent problem. The current episode started more than 1 week ago. The problem occurs constantly. The problem has been gradually worsening. The symptoms are relieved by acetaminophen.  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      Home Medications    Prior to Admission medications   Medication Sig Start Date End Date Taking? Authorizing Provider  acetaminophen (TYLENOL) 500 MG tablet Take 1 tablet (500 mg total) by mouth every 6 (six) hours as needed. 12/19/23  Yes Natalia Wittmeyer, Ludger Nutting, NP  lidocaine (XYLOCAINE) 2 % solution Use as directed 15 mLs in the mouth or throat as needed for mouth  pain. 12/19/23  Yes Elmer Picker, NP  amoxicillin (AMOXIL) 500 MG capsule Take 1 capsule (500 mg total) by mouth 2 (two) times daily for 10 days. 12/17/23 12/27/23  Margaretann Loveless, PA-C  clotrimazole (LOTRIMIN) 1 % cream Apply to affected area 2 times daily 10/22/23   Garrison, [place] N, FNP  fluconazole (DIFLUCAN) 150 MG tablet Take 1 tablet (150 mg total) by mouth daily. 10/22/23   Garrison, [place] N, FNP    Family History Family History  Problem Relation Age of Onset   Hypertension Maternal Grandfather     Social History Social History   Tobacco Use   Smoking status: Never   Smokeless tobacco: Never  Vaping Use   Vaping status: Never Used  Substance Use Topics   Alcohol use: Not Currently    Comment: occasionally   Drug use: Never     Allergies   Blueberry [vaccinium angustifolium] and Kiwi extract   Review of Systems Review of Systems  Musculoskeletal:  Positive for neck pain.     Physical Exam Triage Vital Signs ED Triage Vitals [12/19/23 1030]  Encounter Vitals Group     BP 109/70     Systolic BP Percentile      Diastolic BP Percentile      Pulse Rate 82  Resp 15     Temp 97.9 F (36.6 C)     Temp Source Oral     SpO2 99 %     Weight      Height      Head Circumference      Peak Flow      Pain Score 10     Pain Loc      Pain Education      Exclude from Growth Chart    No data found.  Updated Vital Signs BP 109/70 (BP Location: Left Arm)   Pulse 82   Temp 97.9 F (36.6 C) (Oral)   Resp 15   SpO2 99%   Visual Acuity Right Eye Distance:   Left Eye Distance:   Bilateral Distance:    Right Eye Near:   Left Eye Near:    Bilateral Near:     Physical Exam Constitutional:      Appearance: She is normal weight.  HENT:     Head: Normocephalic.     Mouth/Throat:     Pharynx: Posterior oropharyngeal erythema present.     Tonsils: No tonsillar exudate or tonsillar abscesses. 2+ on the right. 2+ on the left.     Comments: Neck  tenderness with palpation Eyes:     Conjunctiva/sclera: Conjunctivae normal.  Cardiovascular:     Heart sounds: Normal heart sounds.  Pulmonary:     Effort: Pulmonary effort is normal.  Abdominal:     Palpations: Abdomen is soft.  Lymphadenopathy:     Cervical: Cervical adenopathy present.  Skin:    General: Skin is warm and dry.  Neurological:     General: No focal deficit present.     Mental Status: She is alert and oriented to person, place, and time.      UC Treatments / Results  Labs (all labs ordered are listed, but only abnormal results are displayed) Labs Reviewed  POCT RAPID STREP A (OFFICE)    EKG   Radiology No results found.  Procedures Procedures (including critical care time)  Medications Ordered in UC Medications  prednisoLONE (ORAPRED) 15 MG/5ML solution 30 mg (has no administration in time range)    Initial Impression / Assessment and Plan / UC Course  I have reviewed the triage vital signs and the nursing notes.  Pertinent labs & imaging results that were available during my care of the patient were reviewed by me and considered in my medical decision making (see chart for details).   Strep testing negative Symptoms consistent with viral pharyngitis.  Will give her 1 dose of liquid steroid in clinic and prescribe viscous lidocaine and Tylenol for symptom relief.  Patient in agreement and will follow up with ENT outpatient or PCP      Final Clinical Impressions(s) / UC Diagnoses   Final diagnoses:  Pharyngitis, unspecified etiology     Discharge Instructions      Recommend not continuing antibiotics as strep testing was negative, continue using hot tea, vocal rest, Tylenol for pain.  Please follow-up with ENT on Monday as you have had numerous issues with your tonsils and it would be beneficial to have their opinion.  You may also follow-up with your primary care provider as needed.  Please return to urgent care for worsening symptoms  and proceed to the ED emergency department with any red flag symptoms as discussed.     ED Prescriptions     Medication Sig Dispense Auth. Provider   lidocaine (XYLOCAINE) 2 % solution Use  as directed 15 mLs in the mouth or throat as needed for mouth pain. 15 mL Elmer Picker, NP   acetaminophen (TYLENOL) 500 MG tablet Take 1 tablet (500 mg total) by mouth every 6 (six) hours as needed. 30 tablet Elmer Picker, NP      PDMP not reviewed this encounter.   Elmer Picker, NP 12/19/23 1114    Elmer Picker, NP 12/19/23 [phone]

## 2023-12-19 NOTE — ED Triage Notes (Signed)
 Pt reports that she had sore throat and bilateral neck pain since 2/23. Repots taking Theraflu, hot tea,  and cough drops.

## 2024-01-05 ENCOUNTER — Telehealth (INDEPENDENT_AMBULATORY_CARE_PROVIDER_SITE_OTHER): Payer: Self-pay | Admitting: Otolaryngology

## 2024-01-05 NOTE — Telephone Encounter (Signed)
 Spoke with patient and confirmed appt date and location for 01/06/2024.

## 2024-01-06 ENCOUNTER — Encounter (INDEPENDENT_AMBULATORY_CARE_PROVIDER_SITE_OTHER): Payer: Self-pay

## 2024-01-06 ENCOUNTER — Ambulatory Visit (INDEPENDENT_AMBULATORY_CARE_PROVIDER_SITE_OTHER): Payer: MEDICAID | Admitting: Otolaryngology

## 2024-01-06 VITALS — BP 112/79 | HR 83 | Ht 64.0 in | Wt 120.0 lb

## 2024-01-06 DIAGNOSIS — J3501 Chronic tonsillitis: Secondary | ICD-10-CM | POA: Diagnosis not present

## 2024-01-06 DIAGNOSIS — J029 Acute pharyngitis, unspecified: Secondary | ICD-10-CM | POA: Diagnosis not present

## 2024-01-06 NOTE — Progress Notes (Signed)
 Dear Dr. Concepcion Elk, Here is my assessment for our mutual patient, Tiffany Aguilar. Thank you for allowing me the opportunity to care for your patient. Please do not hesitate to contact me should you have any other questions. Sincerely, Dr. Jovita Kussmaul  Otolaryngology Clinic Note Referring provider: Dr. Concepcion Elk HPI:  Tiffany Aguilar is a 23 y.o. female kindly referred by Dr. Concepcion Elk for evaluation of pharyngitis and tonsillitis.  She reports that she had an episode of sore throat and discomfort which started in late February. She was prescribed amoxicillin and lidocaine and it helped her some. She reports that things are better now but not all the way, and she denies throat pain, dysphagia, odynophagia, PNA, unintentional weight loss, changes in voice, shortness of breath, hemoptysis tobacco or significant alcohol history. Denies ear pain, neck masses  She has been having intermittent episodes of sore throat over the past year - multiple times (at least 7 times since Feb 2024) - but reports that most times does not go to ED. She reports additional symptoms include some odynophagia and dysphagia during this, and feels like she has trouble sleeping and breathing. No neck swelling or discomfort. She reports that she does get antibiotics fairly frequently and it does help. Steroids do help as well. No B symptoms. She has had strep testing, but it has come back negative. Never had abscess. This is significantly bothersome for her.  H&N Surgery: no Personal or FHx of bleeding dz or anesthesia difficulty: no  AP/AC: no  Tobacco: no. Alcohol: no. Occupation: student - criminal justice  PMHx: Anemia  Independent Review of Additional Tests or Records:  ED Notes Elmer Picker) 12/19/2023: noted strep throat, Rx amoxicillin; prior seen in ED for tonsillitis, ref to ENT but can't get in contact. Some difficulty swallowing and eating; Dx: viral pharyngitis; Rx: Steroid, conservative mgmt Joycelyn Man PA  (12/16/2023) E-visit Strep throat: Dx: Strep; Rx: Amoxicillin Prior tonsillitis episodes/ED visit (05/2023, 02/2023) - amoxicillin CBC 7/302/2024: WBC 3.6, Hgb 14.1 GAS 03/02/2023: negative  PMH/Meds/All/SocHx/FamHx/ROS:   Past Medical History:  Diagnosis Date   Anemia    Anxiety    Chlamydia    Depression    Trichomoniasis      Past Surgical History:  Procedure Laterality Date   KNEE SURGERY      Family History  Problem Relation Age of Onset   Hypertension Maternal Grandfather      Social Connections: Not on file      Current Outpatient Medications:    amoxicillin (AMOXIL) 500 MG tablet, Take 500 mg by mouth 2 (two) times daily., Disp: , Rfl:    lidocaine (XYLOCAINE) 2 % solution, Use as directed 15 mLs in the mouth or throat as needed for mouth pain., Disp: 15 mL, Rfl: 0   acetaminophen (TYLENOL) 500 MG tablet, Take 1 tablet (500 mg total) by mouth every 6 (six) hours as needed. (Patient not taking: Reported on 01/06/2024), Disp: 30 tablet, Rfl: 0   clotrimazole (LOTRIMIN) 1 % cream, Apply to affected area 2 times daily (Patient not taking: Reported on 01/06/2024), Disp: 28 g, Rfl: 0   fluconazole (DIFLUCAN) 150 MG tablet, Take 1 tablet (150 mg total) by mouth daily. (Patient not taking: Reported on 01/06/2024), Disp: 1 tablet, Rfl: 0   Physical Exam:   BP 112/79 (BP Location: Right Arm, Patient Position: Sitting, Cuff Size: Normal)   Pulse 83   Ht 5\' 4"  (1.626 m)   Wt 120 lb (54.4 kg)   SpO2 98%   BMI  20.60 kg/m   Salient findings:  CN II-XII intact  Bilateral EAC clear and TM intact with well pneumatized middle ear spaces Anterior rhinoscopy: Septum intact; bilateral inferior turbinates without significant hypertrophy No lesions of oral cavity/oropharynx - tonsils are 2+/2+, cryptic, no evidence of abscess today No obviously palpable neck masses/lymphadenopathy/thyromegaly No respiratory distress or stridor. TFL was indicated to better evaluate the proximal airway,  given the patient's history and exam findings, and is detailed below.  Seprately Identifiable Procedures:  Procedure Note Pre-procedure diagnosis: Chronic tonsillitis, sore throat Post-procedure diagnosis: Same Procedure: Transnasal Fiberoptic Laryngoscopy, CPT 31575 - Mod 25 Indication: see above Complications: None apparent EBL: 0 mL  The procedure was undertaken to further evaluate persistent sore throat and tonsillitis, with mirror exam inadequate for appropriate examination due to gag reflex and poor patient tolerance  Procedure:  Patient was identified as correct patient. Verbal consent was obtained. The nose was sprayed with oxymetazoline and 4% lidocaine. The The flexible laryngoscope was passed through the nose to view the nasal cavity, pharynx (oropharynx, hypopharynx) and larynx.  The larynx was examined at rest and during multiple phonatory tasks. Documentation was obtained and reviewed with patient. The scope was removed. The patient tolerated the procedure well.  Findings: The nasal cavity and nasopharynx did not reveal any masses or lesions, mucosa appeared to be without obvious lesions. The tongue base, pharyngeal walls, piriform sinuses, vallecula, epiglottis and postcricoid region are normal in appearance with mild post-cricoid edema The visualized portion of the subglottis and proximal trachea is widely patent. The vocal folds are mobile bilaterally. There are no lesions on the free edge of the vocal folds nor elsewhere in the larynx worrisome for malignancy.    Electronically signed by: Read Drivers, MD 01/06/2024 8:27 AM  Impression & Plans:  Tiffany Aguilar is a 23 y.o. female with:  1. Chronic tonsillitis    We discussed management for this, including observation, antibiotics, and tonsillectomy. Reports at least 7 episodes over past year or so, starting in Feb 2024 with mutliple rounds of antibiotics We discussed R/B/A for Tonsillectomy including significant post-op  pain, bleeding (3%, including life threatening bleeding and requiring return to OR), and infections (still with pharyngitis) as well as persistent symptoms and risk of anesthesia. We also discussed post-op management and risks. I explained that her sore throats can still continue and she can still have persistent symptoms. Despite these risks and risk of lack of improvement, patient insists she would like to proceed with tonsillectomy given how bothersome this is for her. Patient would like to proceed with Tonsillectomy.  Will post - f/u by phone 6 weeks post op  See below regarding exact medications prescribed this encounter including dosages and route: No orders of the defined types were placed in this encounter.     Thank you for allowing me the opportunity to care for your patient. Please do not hesitate to contact me should you have any other questions.  Sincerely, Jovita Kussmaul, MD Otolaryngologist (ENT), [facility] Phone: [phone] Fax: [phone]  01/06/2024, 8:27 AM   MDM:  Level 4 Complexity/Problems addressed: mod  Data complexity: mod - independent review of multiple notes, labs - Morbidity: mod - decision for surgery  - Prescription Drug prescribed or managed: no

## 2024-01-23 ENCOUNTER — Telehealth: Payer: MEDICAID

## 2024-01-23 DIAGNOSIS — T3695XA Adverse effect of unspecified systemic antibiotic, initial encounter: Secondary | ICD-10-CM | POA: Diagnosis not present

## 2024-01-23 DIAGNOSIS — B379 Candidiasis, unspecified: Secondary | ICD-10-CM

## 2024-01-24 ENCOUNTER — Other Ambulatory Visit: Payer: Self-pay

## 2024-01-24 ENCOUNTER — Ambulatory Visit (HOSPITAL_COMMUNITY)
Admission: EM | Admit: 2024-01-24 | Discharge: 2024-01-24 | Disposition: A | Discharge status: Home / Self Care | Payer: MEDICAID | Attending: Emergency Medicine | Admitting: Emergency Medicine

## 2024-01-24 ENCOUNTER — Encounter (HOSPITAL_COMMUNITY): Payer: Self-pay | Admitting: Emergency Medicine

## 2024-01-24 DIAGNOSIS — B3731 Acute candidiasis of vulva and vagina: Secondary | ICD-10-CM | POA: Diagnosis not present

## 2024-01-24 DIAGNOSIS — N898 Other specified noninflammatory disorders of vagina: Secondary | ICD-10-CM | POA: Insufficient documentation

## 2024-01-24 MED ORDER — FLUCONAZOLE 150 MG PO TABS
150.0000 mg | ORAL_TABLET | ORAL | 0 refills | Status: DC | PRN
Start: 2024-01-24 — End: 2024-01-24

## 2024-01-24 MED ORDER — CLOTRIMAZOLE 1 % VA CREA: 1.0000 | TOPICAL_CREAM | Freq: Every day | VAGINAL | 0 refills | Status: AC

## 2024-01-24 MED ORDER — FLUCONAZOLE 150 MG PO TABS: ORAL_TABLET | ORAL | 0 refills | Status: DC

## 2024-01-24 NOTE — ED Notes (Signed)
Declined work note

## 2024-01-24 NOTE — Discharge Instructions (Signed)
 Take one tablet of Diflucan today and another tablet in 3 days if symptoms continue.  Apply 1 applicatorful vaginally once nightly at bedtime for 7 days.   Your swab results will come back over the next few days and someone will call if results are positive and require additionally treatment.   Return here id symptoms persist or worsen.

## 2024-01-24 NOTE — Progress Notes (Signed)

## 2024-01-24 NOTE — ED Triage Notes (Signed)
 Finished amoxicillin therapy 4 days ago  But has also used a scented soap recently

## 2024-01-24 NOTE — ED Triage Notes (Signed)
 Symptoms for 2 days.  Reports vaginal discharge and itching for 2 days.  Noticed last night that labia is swollen.  Denies any bumps  Has not used any medications for symptoms

## 2024-01-24 NOTE — ED Notes (Signed)
 At bedside for [facility], NP examination of patient and obtaining sample.  Labeled at bedside and placed in lab

## 2024-01-24 NOTE — ED Provider Notes (Signed)
 [facility]    CSN: [id number] Arrival date & time: 01/24/24  4098      History   Chief Complaint Chief Complaint  Patient presents with   Vaginal Discharge    HPI Tiffany Aguilar is a 23 y.o. female.   Patient presents with vaginal itching, irritation, and discharge x 2 days. Patient states that she recently finished amoxicillin for pharyngitis and believes she now has a yeast infection.   Patient states that she has been itching her vaginal and now noticed some redness and swelling to her labia.  Patient reports recent unprotected sexual intercourse as well.   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      Home Medications    Prior to Admission medications   Medication Sig Start Date End Date Taking? Authorizing Provider  clotrimazole (GYNE-LOTRIMIN) 1 % vaginal cream Place 1 Applicatorful vaginally at bedtime for 7 days. 01/24/24 01/31/24 Yes Wynonia Lawman A, NP  fluconazole (DIFLUCAN) 150 MG tablet Take one tablet today and one tablet in 3 days if symptoms persist. 01/24/24  Yes Wynonia Lawman A, NP  acetaminophen (TYLENOL) 500 MG tablet Take 1 tablet (500 mg total) by mouth every 6 (six) hours as needed. Patient not taking: Reported on 01/06/2024 12/19/23   Elmer Picker, NP  clotrimazole (LOTRIMIN) 1 % cream Apply to affected area 2 times daily Patient not taking: Reported on 01/06/2024 10/22/23   Garrison, [place] N, FNP  lidocaine (XYLOCAINE) 2 % solution Use as directed 15 mLs in the mouth or throat as needed  for mouth pain. 12/19/23   Elmer Picker, NP    Family History Family History  Problem Relation Age of Onset   Hypertension Maternal Grandfather     Social History Social History   Tobacco Use   Smoking status: Never   Smokeless tobacco: Never  Vaping Use   Vaping status: Never Used  Substance Use Topics   Alcohol use: Not Currently    Comment: occasionally   Drug use: Never     Allergies   Blueberry [vaccinium angustifolium] and Kiwi extract   Review of Systems Review of Systems  Genitourinary:  Positive for vaginal discharge.   Per HPI  Physical Exam Triage Vital Signs ED Triage Vitals  Encounter Vitals Group     BP 01/24/24 1040 110/68     Systolic BP Percentile --      Diastolic BP Percentile --      Pulse Rate 01/24/24 1040 86     Resp 01/24/24 1040 16     Temp 01/24/24 1040 98.8 F (37.1 C)     Temp Source 01/24/24 1040 Oral     SpO2 01/24/24 1040 98 %     Weight --      Height --      Head Circumference --      Peak Flow --      Pain Score 01/24/24 1037 10     Pain Loc --  Pain Education --      Exclude from Growth Chart --    No data found.  Updated Vital Signs BP 110/68 (BP Location: Right Arm)   Pulse 86   Temp 98.8 F (37.1 C) (Oral)   Resp 16   LMP 01/07/2024 (Approximate)   SpO2 98%   Visual Acuity Right Eye Distance:   Left Eye Distance:   Bilateral Distance:    Right Eye Near:   Left Eye Near:    Bilateral Near:     Physical Exam Vitals and nursing note reviewed.  Constitutional:      General: She is awake. She is not in acute distress.    Appearance: Normal appearance. She is well-developed and well-groomed. She is not ill-appearing.  Genitourinary:    Labia:        Right: Tenderness present.        Left: Tenderness present.      Vagina: Vaginal discharge, erythema and tenderness present.  Skin:    General: Skin is warm and dry.  Neurological:     Mental Status: She is alert.  Psychiatric:        Behavior:  Behavior is cooperative.      UC Treatments / Results  Labs (all labs ordered are listed, but only abnormal results are displayed) Labs Reviewed  CERVICOVAGINAL ANCILLARY ONLY    EKG   Radiology No results found.  Procedures Procedures (including critical care time)  Medications Ordered in UC Medications - No data to display  Initial Impression / Assessment and Plan / UC Course  I have reviewed the triage vital signs and the nursing notes.  Pertinent labs & imaging results that were available during my care of the patient were reviewed by me and considered in my medical decision making (see chart for details).     Upon assessment with chaperone present significant amount of white, clumpy discharge consistent with yeast. Tenderness, erythema, irritation, and mild swelling noted to the labia minora and vaginal opening likely related to vaginal irritation from yeast.  Performed cytology swab to confirm no presence of any other infection. Prescribed Diflucan and clotrimazole for yeast vaginitis coverage. Discussed follow-up and return precautions.  Final Clinical Impressions(s) / UC Diagnoses   Final diagnoses:  Yeast vaginitis  Vaginal itching     Discharge Instructions      Take one tablet of Diflucan today and another tablet in 3 days if symptoms continue.  Apply 1 applicatorful vaginally once nightly at bedtime for 7 days.   Your swab results will come back over the next few days and someone will call if results are positive and require additionally treatment.   Return here id symptoms persist or worsen.     ED Prescriptions     Medication Sig Dispense Auth. Provider   fluconazole (DIFLUCAN) 150 MG tablet Take one tablet today and one tablet in 3 days if symptoms persist. 2 tablet Wynonia Lawman A, NP   clotrimazole (GYNE-LOTRIMIN) 1 % vaginal cream Place 1 Applicatorful vaginally at bedtime for 7 days. 45 g Wynonia Lawman A, NP      PDMP not  reviewed this encounter.   Wynonia Lawman A, NP 01/24/24 1140

## 2024-01-25 LAB — CERVICOVAGINAL ANCILLARY ONLY
Bacterial Vaginitis (gardnerella): NEGATIVE
Candida Glabrata: NEGATIVE
Candida Vaginitis: POSITIVE — AB
Chlamydia: NEGATIVE
Comment: NEGATIVE
Comment: NEGATIVE
Comment: NEGATIVE
Comment: NEGATIVE
Comment: NEGATIVE
Comment: NORMAL
Neisseria Gonorrhea: NEGATIVE
Trichomonas: NEGATIVE

## 2024-02-01 ENCOUNTER — Encounter (HOSPITAL_BASED_OUTPATIENT_CLINIC_OR_DEPARTMENT_OTHER): Payer: Self-pay | Admitting: *Deleted

## 2024-02-01 ENCOUNTER — Other Ambulatory Visit: Payer: Self-pay

## 2024-02-09 ENCOUNTER — Ambulatory Visit (HOSPITAL_BASED_OUTPATIENT_CLINIC_OR_DEPARTMENT_OTHER): Payer: MEDICAID | Admitting: Anesthesiology

## 2024-02-09 ENCOUNTER — Ambulatory Visit (HOSPITAL_BASED_OUTPATIENT_CLINIC_OR_DEPARTMENT_OTHER)
Admission: RE | Admit: 2024-02-09 | Discharge: 2024-02-09 | Disposition: A | Discharge status: Home / Self Care | Payer: MEDICAID | Attending: Otolaryngology | Admitting: Otolaryngology

## 2024-02-09 ENCOUNTER — Encounter (HOSPITAL_BASED_OUTPATIENT_CLINIC_OR_DEPARTMENT_OTHER): Payer: Self-pay

## 2024-02-09 ENCOUNTER — Other Ambulatory Visit: Payer: Self-pay

## 2024-02-09 ENCOUNTER — Encounter (HOSPITAL_BASED_OUTPATIENT_CLINIC_OR_DEPARTMENT_OTHER)
Admission: RE | Disposition: A | Discharge status: Home / Self Care | Payer: Self-pay | Source: Home / Self Care | Attending: Otolaryngology

## 2024-02-09 DIAGNOSIS — J0391 Acute recurrent tonsillitis, unspecified: Secondary | ICD-10-CM | POA: Diagnosis not present

## 2024-02-09 DIAGNOSIS — F419 Anxiety disorder, unspecified: Secondary | ICD-10-CM | POA: Insufficient documentation

## 2024-02-09 DIAGNOSIS — J3501 Chronic tonsillitis: Secondary | ICD-10-CM

## 2024-02-09 DIAGNOSIS — F32A Depression, unspecified: Secondary | ICD-10-CM | POA: Insufficient documentation

## 2024-02-09 DIAGNOSIS — Z01818 Encounter for other preprocedural examination: Secondary | ICD-10-CM

## 2024-02-09 HISTORY — DX: Chronic tonsillitis: J35.01

## 2024-02-09 LAB — POCT PREGNANCY, URINE: Preg Test, Ur: NEGATIVE

## 2024-02-09 SURGERY — TONSILLECTOMY
Anesthesia: General | Site: Throat | Laterality: Bilateral

## 2024-02-09 MED ORDER — FENTANYL CITRATE (PF) 100 MCG/2ML IJ SOLN
INTRAMUSCULAR | Status: AC
Start: 2024-02-09 — End: ?
  Filled 2024-02-09: qty 2

## 2024-02-09 MED ORDER — ROCURONIUM BROMIDE 10 MG/ML (PF) SYRINGE
PREFILLED_SYRINGE | INTRAVENOUS | Status: AC
  Filled 2024-02-09: qty 10

## 2024-02-09 MED ORDER — ROCURONIUM BROMIDE 100 MG/10ML IV SOLN
INTRAVENOUS | Status: DC | PRN
  Administered 2024-02-09: 40 mg via INTRAVENOUS

## 2024-02-09 MED ORDER — AMISULPRIDE (ANTIEMETIC) 5 MG/2ML IV SOLN
10.0000 mg | Freq: Once | INTRAVENOUS | Status: AC
  Administered 2024-02-09: 10 mg via INTRAVENOUS

## 2024-02-09 MED ORDER — LACTATED RINGERS IV SOLN: INTRAVENOUS | Status: DC

## 2024-02-09 MED ORDER — AMISULPRIDE (ANTIEMETIC) 5 MG/2ML IV SOLN
INTRAVENOUS | Status: AC
  Filled 2024-02-09: qty 4

## 2024-02-09 MED ORDER — FENTANYL CITRATE (PF) 100 MCG/2ML IJ SOLN
25.0000 ug | INTRAMUSCULAR | Status: DC | PRN
  Administered 2024-02-09: 50 ug via INTRAVENOUS

## 2024-02-09 MED ORDER — ONDANSETRON HCL 4 MG/2ML IJ SOLN
INTRAMUSCULAR | Status: AC
  Filled 2024-02-09: qty 2

## 2024-02-09 MED ORDER — PROPOFOL 10 MG/ML IV BOLUS
INTRAVENOUS | Status: AC
  Filled 2024-02-09: qty 20

## 2024-02-09 MED ORDER — FENTANYL CITRATE (PF) 100 MCG/2ML IJ SOLN
INTRAMUSCULAR | Status: AC
  Filled 2024-02-09: qty 2

## 2024-02-09 MED ORDER — LIDOCAINE 2% (20 MG/ML) 5 ML SYRINGE
INTRAMUSCULAR | Status: AC
  Filled 2024-02-09: qty 5

## 2024-02-09 MED ORDER — OXYCODONE HCL 5 MG/5ML PO SOLN
ORAL | Status: AC
Start: 2024-02-09 — End: ?
  Filled 2024-02-09: qty 5

## 2024-02-09 MED ORDER — 0.9 % SODIUM CHLORIDE (POUR BTL) OPTIME
TOPICAL | Status: DC | PRN
  Administered 2024-02-09: 100 mL

## 2024-02-09 MED ORDER — ACETAMINOPHEN 10 MG/ML IV SOLN: 1000.0000 mg | Freq: Once | INTRAVENOUS | Status: DC | PRN

## 2024-02-09 MED ORDER — DEXAMETHASONE SODIUM PHOSPHATE 4 MG/ML IJ SOLN
INTRAMUSCULAR | Status: DC | PRN
  Administered 2024-02-09: 10 mg via INTRAVENOUS

## 2024-02-09 MED ORDER — SUGAMMADEX SODIUM 200 MG/2ML IV SOLN
INTRAVENOUS | Status: DC | PRN
Start: 2024-02-09 — End: 2024-02-09
  Administered 2024-02-09: 200 mg via INTRAVENOUS

## 2024-02-09 MED ORDER — ACETAMINOPHEN 160 MG/5ML PO SUSP: 650.0000 mg | Freq: Four times a day (QID) | ORAL | 2 refills | Status: AC | PRN

## 2024-02-09 MED ORDER — OXYCODONE HCL 5 MG PO TABS: 5.0000 mg | ORAL_TABLET | ORAL | 0 refills | Status: DC | PRN

## 2024-02-09 MED ORDER — IBUPROFEN 100 MG/5ML PO SUSP: 400.0000 mg | Freq: Four times a day (QID) | ORAL | 1 refills | Status: DC

## 2024-02-09 MED ORDER — OXYCODONE HCL 5 MG/5ML PO SOLN
5.0000 mg | Freq: Once | ORAL | Status: AC | PRN
  Administered 2024-02-09: 5 mg via ORAL

## 2024-02-09 MED ORDER — DEXAMETHASONE SODIUM PHOSPHATE 10 MG/ML IJ SOLN
INTRAMUSCULAR | Status: AC
  Filled 2024-02-09: qty 1

## 2024-02-09 MED ORDER — MIDAZOLAM HCL 5 MG/5ML IJ SOLN
INTRAMUSCULAR | Status: DC | PRN
  Administered 2024-02-09: 2 mg via INTRAVENOUS

## 2024-02-09 MED ORDER — LIDOCAINE 2% (20 MG/ML) 5 ML SYRINGE
INTRAMUSCULAR | Status: DC | PRN
  Administered 2024-02-09: 40 mg via INTRAVENOUS

## 2024-02-09 MED ORDER — MIDAZOLAM HCL 2 MG/2ML IJ SOLN
INTRAMUSCULAR | Status: AC
  Filled 2024-02-09: qty 2

## 2024-02-09 MED ORDER — PROPOFOL 10 MG/ML IV BOLUS
INTRAVENOUS | Status: DC | PRN
  Administered 2024-02-09: 130 mg via INTRAVENOUS

## 2024-02-09 MED ORDER — OXYCODONE HCL 5 MG PO TABS: 5.0000 mg | ORAL_TABLET | Freq: Once | ORAL | Status: AC | PRN

## 2024-02-09 MED ORDER — ONDANSETRON HCL 4 MG/2ML IJ SOLN
INTRAMUSCULAR | Status: DC | PRN
  Administered 2024-02-09: 4 mg via INTRAVENOUS

## 2024-02-09 MED ORDER — FENTANYL CITRATE (PF) 100 MCG/2ML IJ SOLN
INTRAMUSCULAR | Status: DC | PRN
  Administered 2024-02-09 (×2): 50 ug via INTRAVENOUS

## 2024-02-09 SURGICAL SUPPLY — 34 items
CANISTER SUCT 1200ML W/VALVE (MISCELLANEOUS) ×1 IMPLANT
CATH ROBINSON RED A/P 10FR (CATHETERS) IMPLANT
CATH ROBINSON RED A/P 12FR (CATHETERS) IMPLANT
CLEANER CAUTERY TIP PAD (MISCELLANEOUS) ×1 IMPLANT
COAGULATOR SUCT 8FR VV (MISCELLANEOUS) IMPLANT
COAGULATOR SUCT SWTCH 10FR 6 (ELECTROSURGICAL) ×1 IMPLANT
COVER BACK TABLE 60X90IN (DRAPES) ×1 IMPLANT
COVER MAYO STAND STRL (DRAPES) ×1 IMPLANT
DEFOGGER MIRROR 1QT (MISCELLANEOUS) ×1 IMPLANT
ELECT COATED BLADE 2.86 ST (ELECTRODE) ×1 IMPLANT
ELECTRODE REM PT RETRN 9FT PED (ELECTROSURGICAL) IMPLANT
ELECTRODE REM PT RTRN 9FT ADLT (ELECTROSURGICAL) IMPLANT
GAUZE SPONGE 4X4 12PLY STRL LF (GAUZE/BANDAGES/DRESSINGS) ×2 IMPLANT
GLOVE BIO SURGEON STRL SZ 6.5 (GLOVE) ×1 IMPLANT
GLOVE BIOGEL PI IND STRL 7.5 (GLOVE) IMPLANT
GLOVE SURG SYN 7.5 E (GLOVE) ×1 IMPLANT
GLOVE SURG SYN 7.5 PF PI (GLOVE) IMPLANT
GOWN STRL REUS W/ TWL LRG LVL3 (GOWN DISPOSABLE) ×1 IMPLANT
HEMOSTAT ARISTA ABSORB 3G PWDR (HEMOSTASIS) IMPLANT
MANIFOLD NEPTUNE II (INSTRUMENTS) ×1 IMPLANT
MARKER SKIN DUAL TIP RULER LAB (MISCELLANEOUS) IMPLANT
NS IRRIG 1000ML POUR BTL (IV SOLUTION) ×1 IMPLANT
PENCIL SMOKE EVACUATOR (MISCELLANEOUS) ×1 IMPLANT
SHEET MEDIUM DRAPE 40X70 STRL (DRAPES) ×1 IMPLANT
SLEEVE SCD COMPRESS KNEE MED (STOCKING) IMPLANT
SPONGE TONSIL 1 RF SGL (DISPOSABLE) IMPLANT
SPONGE TONSIL 1.25 RF SGL STRG (GAUZE/BANDAGES/DRESSINGS) IMPLANT
SUT VIC AB 3-0 SH 27X BRD (SUTURE) IMPLANT
SYR BULB EAR ULCER 3OZ GRN STR (SYRINGE) ×1 IMPLANT
TOWEL GREEN STERILE FF (TOWEL DISPOSABLE) ×1 IMPLANT
TUBE CONNECTING 20X1/4 (TUBING) ×2 IMPLANT
TUBE SALEM SUMP 12FR 48 (TUBING) IMPLANT
TUBE SALEM SUMP 16F (TUBING) IMPLANT
YANKAUER SUCT BULB TIP NO VENT (SUCTIONS) ×1 IMPLANT

## 2024-02-09 NOTE — H&P (Signed)
 Pre-Operative H&P - Day Of Surgery Patient Name: Tiffany Aguilar Date:   02/09/2024  HPI: Tiffany Aguilar is a 23 y.o. female who presents today for operative treatment of chronic and recurrent tonsillitis. Patient denies recent significant changes to health or significant new medications or physiologic change in condition which would immediately impact plans. No new types of therapy has been initiated that would change the plan or the appropriateness of the plan.   ROS:  A complete review of systems was obtained and is otherwise negative.   PMH:  Past Medical History:  Diagnosis Date   Anemia    Anxiety    Chlamydia    Chronic tonsillitis    Depression    Trichomoniasis     PSH:  Past Surgical History:  Procedure Laterality Date   KNEE SURGERY      MEDS:   Current Facility-Administered Medications:    lactated ringers  infusion, , Intravenous, Continuous, Conard Decent, MD, Last Rate: 10 mL/hr at 02/09/24 1111, Continued from Pre-op at 02/09/24 1111  ALLERGIES: Blueberry [vaccinium angustifolium] and Kiwi extract  EXAM: Vitals: BP 109/84   Pulse 83   Temp 97.7 F (36.5 C) (Temporal)   Resp 16   Ht 5\' 4"  (1.626 m)   Wt 57.2 kg   LMP 02/08/2024 (Exact Date)   SpO2 100%   BMI 21.65 kg/m   General Awake, at baseline alertness.   HEENT No scleral icterus or conjunctival hemorrhage. Globe position appears normal. External ears  normal. Nose patent without rhinorrhea. No lymphadenopathy. No thyromegaly  Cardiovascular No cyanosis.  Pulmonary No audible stridor. Breathing easily with no labor.  Neuro Symmetric facial movement.   Psychiatry Appropriate affect and mood.  Skin No scars or lesions on face or neck.  Extermities Moves all extremities with normal range of motion.   Other Findings None.   Assessment & Plan: Tiffany Aguilar has diagnoses of recurrent and chronic tonsillitis and will go to the OR today for bilateral tonsillectomy. Informed consent was obtained and  available in EMR today. All questions have been answered, and risks/benefits/alternatives of procedure as noted in the consent were discussed in a quiet area. Questions were invited and answered. The patient expressed understanding, provided consent and wished to proceed despite risks.  We discussed R/B/A for Tonsillectomy including significant post-op pain, bleeding (3%, including life threatening bleeding and requiring return to OR), and infections (still with pharyngitis) as well as persistent symptoms and risk of anesthesia. We also discussed post-op management and risks. Patient wishes to proceed   Evelina Hippo 02/09/2024 12:36 PM

## 2024-02-09 NOTE — Op Note (Signed)
 Otolaryngology Operative note  Tiffany Aguilar Date/Time of Admission: 02/09/2024 10:04 AM  CSN: [id number];MRN:[id number]  DOB: July 04, 2001 Age: 23 y.o. Location: [facility]    Pre-Op Diagnosis: Bilateral Chronic Tonsillitis  Post-Op Diagnosis: Same  Procedure: Procedure(s): Bilateral tonsillectomy age > 62  Surgeon: Milon Aloe, MD  Anesthesia type:  General  Anesthesiologist: Anesthesiologist: Peggy Bowens, MD; Stoltzfus, Valente Gaskin, DO CRNA: Elvia Hammans, CRNA; Arvilla Birmingham, CRNA   Staff: Circulator: Glynn Lasso, RN Relief Circulator: Jerona Mooring, RN Scrub Person: Chrystie Crass A  Implants: None  Specimens: ID Type Source Tests Collected by Time Destination  1 : Left Tonsil Tissue PATH Tonsil/Adenoid SURGICAL PATHOLOGY Evelina Hippo, MD 02/09/2024 1337   2 : Right Tonsil Tissue PATH Tonsil/Adenoid SURGICAL PATHOLOGY Evelina Hippo, MD 02/09/2024 1338     EBL: 20cc  Post-op disposition and condition: PACU, hemodynamically stable  Findings: tonsils 2+/2+, multiple stones right tonsil  Complications: None apparent  Indications and consent:  Tiffany Aguilar is a 23 y.o. female with diagnoses above with multiple exacerbations. The patient's options were discussed, including risks/benefits/alternatives for each option. Patient expressed understanding, and despite these risks, consented and decided to proceed with above procedures. Informed consent was signed before proceeding.  OPERATIVE DETAILS:  After being properly identified in the preoperative holding area, the patient was brought into the operating suite. Patient was placed supine on the operating table. A pre-procedural time-out was performed and general anesthesia was initiated and patient intubated. Intraoperative Decadron  was administered.  The bed was then turned 90 degrees and the patient was prepped and draped in the usual fashion for tonsillectomy. The mouth was held  open in suspension using a Crowe-Davis mouth gag and Mayo stand. There was no evidence of submucosal cleft palate. The left tonsil was grasped using an Allis and removed in a capsular plane using the protected spatula tip Bovie (20 cut, 20 coag). The right tonsil was removed in a similar fashion. Small areas of bleeding and visible blood vessels were coagulated using suction cautery at 20 watts to achieve hemostasis. The operative field was then irrigated and meticulously checked for hemostasis. The Crowe-Davis was released and a flexible suction was used to remove stomach and oropharynx contents. The patient was resuspended and the tonsillar fossae were rechecked for hemostasis. There was no bleeding. The patient tolerated the procedure well.  With the surgical portion of the procedure complete, all instrumentation was then removed from the operative field. The patient's skin was cleaned.  She was returned to the care of the anesthesia team.  She was then weaned from her anesthetic and transported to the PACU in stable condition.  Jervon Ream B Carmisha Larusso

## 2024-02-09 NOTE — Anesthesia Postprocedure Evaluation (Signed)
 Anesthesia Post Note  Patient: Tiffany Aguilar  Procedure(s) Performed: TONSILLECTOMY (Bilateral: Throat)     Patient location during evaluation: PACU Anesthesia Type: General Level of consciousness: awake and alert Pain management: pain level controlled Vital Signs Assessment: post-procedure vital signs reviewed and stable Respiratory status: spontaneous breathing, nonlabored ventilation, respiratory function stable and patient connected to nasal cannula oxygen Cardiovascular status: blood pressure returned to baseline and stable Postop Assessment: no apparent nausea or vomiting Anesthetic complications: no  No notable events documented.  Last Vitals:  Vitals:   02/09/24 1432 02/09/24 1502  BP: 111/83 128/80  Pulse: 84 71  Resp: 13 17  Temp:  (!) 36.2 C  SpO2: 99% 100%    Last Pain:  Vitals:   02/09/24 1514  TempSrc:   PainSc: 10-Worst pain ever                 Melvenia Stabs

## 2024-02-09 NOTE — Transfer of Care (Signed)
 Immediate Anesthesia Transfer of Care Note  Patient: Tiffany Aguilar  Procedure(s) Performed: TONSILLECTOMY (Bilateral: Throat)  Patient Location: PACU  Anesthesia Type:General  Level of Consciousness: awake, alert , and oriented  Airway & Oxygen Therapy: Patient Spontanous Breathing and Patient connected to face mask oxygen  Post-op Assessment: Report given to RN and Post -op Vital signs reviewed and unstable, Anesthesiologist notified  Post vital signs: Reviewed and stable  Last Vitals:  Vitals Value Taken Time  BP 123/63 02/09/24 1415  Temp    Pulse 89 02/09/24 1418  Resp 12 02/09/24 1418  SpO2 99 % 02/09/24 1418  Vitals shown include unfiled device data.  Last Pain:  Vitals:   02/09/24 1036  TempSrc: Temporal  PainSc: 4       Patients Stated Pain Goal: 4 (02/09/24 1036)  Complications: No notable events documented.

## 2024-02-09 NOTE — Discharge Instructions (Addendum)
 Tonsillectomy Discharge Instructions (Dr. Lydia Sams) ENT Office Contact Info: ENT Nursing Triage (Monday-Friday daytime working hours or for emergencies after hours) [phone]  Effects of Anesthesia Tonsillectomy (with or without Adenoidectomy) involves a brief anesthesia, typically 20 - 60 minutes. Patients may be quite irritable for several hours after surgery. If sedatives were given, some patients will remain sleepy for much of the day. Nausea and vomiting is occasionally seen, and usually resolves by the evening of surgery - even without additional medications.  Medications Tonsillectomy is a painful procedure. Pain medications help but do not completely alleviate the discomfort.  For pain, ALTERNATE between Tylenol  and Motrin  and give a dose every 3 hours (i.e. Tylenol  given at 12pm, then Motrin  at 3pm then Tylenol  at 6pm). It is fine to use generic store brands instead of brand name -- [employer] generic has a taste tolerated by most. You do not need to wait for complaint of pain to give them medication, scheduled dosing of medications will control the pain more effectively. Please take 650 mg of Tylenol  and 400 mg of Ibuprofen  every 6 hours. For people over 62 years of age or adults, we will also prescribe a narcotic pain medication - take 5 mg of oxycodone  every 4 hours - you can crush this. Please do not mix with any other narcotic medications or drive while taking narcotics. For adults, do not take more than 4000mg  Tylenol  over a 24 hour period. If you have liver disease, this amount should be less than 2000mg   Activity  Vigorous exercise should be avoided for 14 days after surgery. Baths and showers are fine. Many patients have reduced energy levels until their pain decrease.  You should not travel out of the local area for a full 2 weeks after surgery in case you experience bleeding after surgery.   Eating & Drinking Dehydration is the biggest enemy in the recovery period. It will  increase the pain, increase the risk of bleeding and delay the healing. It usually happens because the pain of swallowing keeps the patient from drinking enough liquids. The only drinks to avoid are citrus like orange and grapefruit juices because they will burn the back of the throat. Some patients will have a small amount of liquid come out of their nose when they drink after surgery, this should stop within a few weeks after surgery. Although drinking is more important, eating is fine even the day of surgery but avoid foods that are crunchy or have sharp edges for 10 days. Dairy products may be taken, if desired. You should avoid acidic, salty and spicy foods (especially tomato sauces). Almost everyone loses some weight after tonsillectomy (which is usually regained in the 2nd or 3rd week after surgery).  Drinking is far more important that eating in the first 14 days after surgery, so concentrate on that first and foremost. Adequate liquid intake probably speeds recovery.  Other things.  If the tonsils  are very large, the patient's voice may change after surgery.  The recovery from tonsillectomy is a very painful period, often the worst pain people can recall, so please be understanding and patient with yourself, or the patient you are caring for. It is helpful to take pain medicine during the night if the patient awakens-- the worst pain is usually in the morning. The pain may seem to increase 2-5 days after surgery -this is normal when inflammation sets in. Please be aware that no combination of medicines will eliminate the pain - the patient will need  to continue eating/drinking in spite of the remaining discomfort.  What should we expect after surgery? As previously mentioned, most patients have a significant amount of pain after tonsillectomy, with pain resolving 7-14 days after surgery. Older children and adults seem to have more discomfort. Most patients can go home the day of surgery.  Ear  pain: Many people will complain of earaches after tonsillectomy. This is normal and should resolve. Give pain medications and encourage liquid intake.  Fever: Many patients have a low-grade fever after tonsillectomy - up to 101.5 degrees (380 C.) for several days. Higher prolonged fever should be reported to your surgeon.  Bad looking (and bad smelling) throat: After surgery, the place where the tonsils were removed is covered with a white film, which is a moist scab. This usually develops 3-5 days after surgery and falls off 10-14 days after surgery and usually causes bad breath. There will be some redness and swelling as well. The uvula (the part of the throat that hangs down in the middle between the tonsils) is usually swollen for several days after surgery.  Sore/bruised feeling of Tongue: This is common for the first few days after surgery because the tongue is pushed out of the way to take out the tonsils in surgery.  When should we call the doctor?  Nausea/Vomiting: This is a common side effect from General Anesthesia and can last up to 24-36 hours after surgery. Try giving sips of clear liquids like Sprite, water or apple juice then gradually increase fluid intake. If the nausea or vomiting continues beyond this time frame, call the doctor's office for medications that will help relieve the nausea and vomiting.  Bleeding: Significant bleeding is rare, but it happens to about 5% of patients who have tonsillectomy. It may come from the nose, the mouth, or be vomited or coughed up. Ice water mouthwashes may help stop or reduce bleeding. Please call our office or go to the ED for any bleeding from nose or mouth.  Dehydration: If there has been little or no liquids intake for 24 hours, the patient may need to come to the hospital for IV fluids. Signs of dehydration include lethargy, the lack of tears when crying, and reduced or very concentrated urine output.  High Fever: If the patient has a consistent  temperatures greater than 102, or when accompanied by cough or difficulty breathing, you should call the doctor's office.    Post Anesthesia Home Care Instructions  Activity: Get plenty of rest for the remainder of the day. A responsible individual must stay with you for 24 hours following the procedure.  For the next 24 hours, DO NOT: -Drive a car -Advertising copywriter -Drink alcoholic beverages -Take any medication unless instructed by your physician -Make any legal decisions or sign important papers.  Meals: Start with liquid foods such as gelatin or soup. Progress to regular foods as tolerated. Avoid greasy, spicy, heavy foods. If nausea and/or vomiting occur, drink only clear liquids until the nausea and/or vomiting subsides. Call your physician if vomiting continues.  Special Instructions/Symptoms: Your throat may feel dry or sore from the anesthesia or the breathing tube placed in your throat during surgery. If this causes discomfort, gargle with warm salt water. The discomfort should disappear within 24 hours.  If you had a scopolamine patch placed behind your ear for the management of post- operative nausea and/or vomiting:  1. The medication in the patch is effective for 72 hours, after which it should be removed.  Wrap patch in a tissue and discard in the trash. Wash hands thoroughly with soap and water. 2. You may remove the patch earlier than 72 hours if you experience unpleasant side effects which may include dry mouth, dizziness or visual disturbances. 3. Avoid touching the patch. Wash your hands with soap and water after contact with the patch.

## 2024-02-09 NOTE — Anesthesia Procedure Notes (Signed)
 Procedure Name: Intubation Date/Time: 02/09/2024 1:20 PM  Performed by: Arvilla Birmingham, CRNAPre-anesthesia Checklist: Patient identified, Emergency Drugs available, Suction available and Patient being monitored Patient Re-evaluated:Patient Re-evaluated prior to induction Oxygen Delivery Method: Circle system utilized Preoxygenation: Pre-oxygenation with 100% oxygen Induction Type: IV induction Ventilation: Mask ventilation without difficulty Laryngoscope Size: Mac and 3 Grade View: Grade I Tube type: Oral Tube size: 7.0 mm Number of attempts: 1 Airway Equipment and Method: Stylet and Oral airway Placement Confirmation: ETT inserted through vocal cords under direct vision, positive ETCO2 and breath sounds checked- equal and bilateral Secured at: 21 cm Tube secured with: Tape Dental Injury: Teeth and Oropharynx as per pre-operative assessment

## 2024-02-09 NOTE — Anesthesia Preprocedure Evaluation (Addendum)
 Anesthesia Evaluation  Patient identified by MRN, date of birth, ID band Patient awake    Reviewed: Allergy & Precautions, NPO status , Patient's Chart, lab work & pertinent test results  Airway Mallampati: II  TM Distance: >3 FB Neck ROM: Full    Dental no notable dental hx.    Pulmonary neg pulmonary ROS   Pulmonary exam normal        Cardiovascular negative cardio ROS  Rhythm:Regular Rate:Normal     Neuro/Psych   Anxiety Depression       GI/Hepatic negative GI ROS, Neg liver ROS,,,  Endo/Other  negative endocrine ROS    Renal/GU negative Renal ROS  negative genitourinary   Musculoskeletal   Abdominal Normal abdominal exam  (+)   Peds  Hematology  (+) Blood dyscrasia, anemia   Anesthesia Other Findings   Reproductive/Obstetrics Lab Results      Component                Value               Date                      PREGTESTUR               NEGATIVE            02/09/2024                HCG                      <5.0                07/04/2022                                        Anesthesia Physical Anesthesia Plan  ASA: 2  Anesthesia Plan: General   Post-op Pain Management:    Induction: Intravenous  PONV Risk Score and Plan: 3 and Ondansetron , Dexamethasone , Midazolam  and Treatment may vary due to age or medical condition  Airway Management Planned: Mask and Oral ETT  Additional Equipment: None  Intra-op Plan:   Post-operative Plan: Extubation in OR  Informed Consent: I have reviewed the patients History and Physical, chart, labs and discussed the procedure including the risks, benefits and alternatives for the proposed anesthesia with the patient or authorized representative who has indicated his/her understanding and acceptance.     Dental advisory given  Plan Discussed with: CRNA  Anesthesia Plan Comments:        Anesthesia Quick Evaluation

## 2024-02-10 ENCOUNTER — Encounter (HOSPITAL_BASED_OUTPATIENT_CLINIC_OR_DEPARTMENT_OTHER): Payer: Self-pay | Admitting: Otolaryngology

## 2024-02-10 LAB — SURGICAL PATHOLOGY

## 2024-02-25 ENCOUNTER — Telehealth: Payer: MEDICAID | Admitting: Physician Assistant

## 2024-02-25 DIAGNOSIS — B9689 Other specified bacterial agents as the cause of diseases classified elsewhere: Secondary | ICD-10-CM

## 2024-02-25 DIAGNOSIS — N760 Acute vaginitis: Secondary | ICD-10-CM | POA: Diagnosis not present

## 2024-02-25 MED ORDER — METRONIDAZOLE 500 MG PO TABS: 500.0000 mg | ORAL_TABLET | Freq: Two times a day (BID) | ORAL | 0 refills | Status: AC

## 2024-02-25 NOTE — Progress Notes (Signed)

## 2024-03-22 ENCOUNTER — Telehealth (INDEPENDENT_AMBULATORY_CARE_PROVIDER_SITE_OTHER): Payer: Self-pay | Admitting: Otolaryngology

## 2024-03-22 ENCOUNTER — Ambulatory Visit (INDEPENDENT_AMBULATORY_CARE_PROVIDER_SITE_OTHER): Payer: MEDICAID | Admitting: Otolaryngology

## 2024-03-22 NOTE — Telephone Encounter (Signed)
 Attempted to call patient for phone visit for post op tonsillectomy. Unable to reach. Tiffany Aguilar

## 2024-04-05 ENCOUNTER — Telehealth: Payer: MEDICAID | Admitting: Physician Assistant

## 2024-04-05 DIAGNOSIS — N3090 Cystitis, unspecified without hematuria: Secondary | ICD-10-CM | POA: Diagnosis not present

## 2024-04-05 MED ORDER — CEPHALEXIN 500 MG PO CAPS: 500.0000 mg | ORAL_CAPSULE | Freq: Two times a day (BID) | ORAL | 0 refills | Status: AC

## 2024-04-05 NOTE — Progress Notes (Signed)

## 2024-04-10 ENCOUNTER — Telehealth: Payer: MEDICAID | Admitting: Family Medicine

## 2024-04-10 DIAGNOSIS — R42 Dizziness and giddiness: Secondary | ICD-10-CM

## 2024-04-10 DIAGNOSIS — R197 Diarrhea, unspecified: Secondary | ICD-10-CM

## 2024-04-10 NOTE — Progress Notes (Signed)
  Because you are dizzy, and currently on medication as well for UTi infection we feel your condition warrants further evaluation and we recommend that you be seen in a face-to-face visit.   NOTE: There will be NO CHARGE for this E-Visit   If you are having a true medical emergency, please call 911.

## 2024-04-12 ENCOUNTER — Telehealth: Payer: MEDICAID | Admitting: Physician Assistant

## 2024-04-12 DIAGNOSIS — T22219A Burn of second degree of unspecified forearm, initial encounter: Secondary | ICD-10-CM

## 2024-04-12 MED ORDER — MUPIROCIN 2 % EX OINT: 1.0000 | TOPICAL_OINTMENT | Freq: Two times a day (BID) | CUTANEOUS | 0 refills | Status: DC
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
## 2024-04-12 NOTE — Progress Notes (Signed)
 I have spent 5 minutes in review of e-visit questionnaire, review and updating patient chart, medical decision making and response to patient.   Piedad Climes, PA-C

## 2024-04-19 ENCOUNTER — Telehealth: Payer: MEDICAID | Admitting: Physician Assistant

## 2024-04-19 DIAGNOSIS — B9689 Other specified bacterial agents as the cause of diseases classified elsewhere: Secondary | ICD-10-CM

## 2024-04-19 DIAGNOSIS — B379 Candidiasis, unspecified: Secondary | ICD-10-CM | POA: Diagnosis not present

## 2024-04-19 DIAGNOSIS — N760 Acute vaginitis: Secondary | ICD-10-CM

## 2024-04-19 DIAGNOSIS — T3695XA Adverse effect of unspecified systemic antibiotic, initial encounter: Secondary | ICD-10-CM | POA: Diagnosis not present

## 2024-04-19 MED ORDER — METRONIDAZOLE 500 MG PO TABS: 500.0000 mg | ORAL_TABLET | Freq: Two times a day (BID) | ORAL | 0 refills | Status: DC

## 2024-04-19 MED ORDER — FLUCONAZOLE 150 MG PO TABS: ORAL_TABLET | ORAL | 0 refills | Status: DC

## 2024-04-19 NOTE — Progress Notes (Signed)
 E-Visit for Vaginal Symptoms  We are sorry that you are not feeling well. Here is how we plan to help! Based on what you shared with me it looks like you: May have a vaginosis due to bacteria  Vaginosis is an inflammation of the vagina that can result in discharge, itching and pain. The cause is usually a change in the normal balance of vaginal bacteria or an infection. Vaginosis can also result from reduced estrogen levels after menopause.  The most common causes of vaginosis are:   Bacterial vaginosis which results from an overgrowth of one on several organisms that are normally present in your vagina.   Yeast infections which are caused by a naturally occurring fungus called candida.   Vaginal atrophy (atrophic vaginosis) which results from the thinning of the vagina from reduced estrogen levels after menopause.   Trichomoniasis which is caused by a parasite and is commonly transmitted by sexual intercourse.  Factors that increase your risk of developing vaginosis include: Medications, such as antibiotics and steroids Uncontrolled diabetes Use of hygiene products such as bubble bath, vaginal spray or vaginal deodorant Douching Wearing damp or tight-fitting clothing Using an intrauterine device (IUD) for birth control Hormonal changes, such as those associated with pregnancy, birth control pills or menopause Sexual activity Having a sexually transmitted infection  Your treatment plan is Metronidazole  or Flagyl  500mg  twice a day for 7 days.  I have electronically sent this prescription into the pharmacy that you have chosen. I have sent in Diflucan  in case of yeast, especially with antibiotic use.  Be sure to take all of the medication as directed. Stop taking any medication if you develop a rash, tongue swelling or shortness of breath. Mothers who are breast feeding should consider pumping and discarding their breast milk while on these antibiotics. However, there is no consensus that  infant exposure at these doses would be harmful.  Remember that medication creams can weaken latex condoms. SABRA   HOME CARE:  Good hygiene may prevent some types of vaginosis from recurring and may relieve some symptoms:  Avoid baths, hot tubs and whirlpool spas. Rinse soap from your outer genital area after a shower, and dry the area well to prevent irritation. Don't use scented or harsh soaps, such as those with deodorant or antibacterial action. Avoid irritants. These include scented tampons and pads. Wipe from front to back after using the toilet. Doing so avoids spreading fecal bacteria to your vagina.  Other things that may help prevent vaginosis include:  Don't douche. Your vagina doesn't require cleansing other than normal bathing. Repetitive douching disrupts the normal organisms that reside in the vagina and can actually increase your risk of vaginal infection. Douching won't clear up a vaginal infection. Use a latex condom. Both female and female latex condoms may help you avoid infections spread by sexual contact. Wear cotton underwear. Also wear pantyhose with a cotton crotch. If you feel comfortable without it, skip wearing underwear to bed. Yeast thrives in [employer] Your symptoms should improve in the next day or two.  GET HELP RIGHT AWAY IF:  You have pain in your lower abdomen ( pelvic area or over your ovaries) You develop nausea or vomiting You develop a fever Your discharge changes or worsens You have persistent pain with intercourse You develop shortness of breath, a rapid pulse, or you faint.  These symptoms could be signs of problems or infections that need to be evaluated by a medical provider now.  MAKE SURE YOU  Understand these instructions. Will watch your condition. Will get help right away if you are not doing well or get worse.  Thank you for choosing an e-visit.  Your e-visit answers were reviewed by a board certified advanced clinical  practitioner to complete your personal care plan. Depending upon the condition, your plan could have included both over the counter or prescription medications.  Please review your pharmacy choice. Make sure the pharmacy is open so you can pick up prescription now. If there is a problem, you may contact your provider through [employer] and have the prescription routed to another pharmacy.  Your safety is important to us . If you have drug allergies check your prescription carefully.   For the next 24 hours you can use MyChart to ask questions about today's visit, request a non-urgent call back, or ask for a work or school excuse. You will get an email in the next two days asking about your experience. I hope that your e-visit has been valuable and will speed your recovery.

## 2024-04-19 NOTE — Progress Notes (Signed)
 I have spent 5 minutes in review of e-visit questionnaire, review and updating patient chart, medical decision making and response to patient.   Piedad Climes, PA-C

## 2024-04-23 ENCOUNTER — Telehealth: Payer: MEDICAID | Admitting: Family Medicine

## 2024-04-23 DIAGNOSIS — R220 Localized swelling, mass and lump, head: Secondary | ICD-10-CM

## 2024-04-23 NOTE — Progress Notes (Signed)
 Pt did not show for visit [facility]

## 2024-04-23 NOTE — Progress Notes (Signed)
   Thank you for the details you included in the comment boxes. Those details are very helpful in determining the best course of treatment for you and help us  to provide the best care.Because Ms. Claudene, we recommend that you schedule a Virtual Urgent Care video visit in order for the provider to better assess what is going on.  The provider will be able to give you a more accurate diagnosis and treatment plan if we can more freely discuss your symptoms and with the addition of a virtual examination.   If you change your visit to a video visit, we will bill your insurance (similar to an office visit) and you will not be charged for this e-Visit. You will be able to stay at home and speak with the first available [facility] advanced practice provider. The link to do a video visit is in the drop down Menu tab of your Welcome screen in MyChart.    have provided 5 minutes of non face to face time during this encounter for chart review and documentation.

## 2024-05-17 ENCOUNTER — Telehealth: Payer: MEDICAID | Admitting: Physician Assistant

## 2024-05-17 DIAGNOSIS — A084 Viral intestinal infection, unspecified: Secondary | ICD-10-CM

## 2024-05-17 MED ORDER — ONDANSETRON 4 MG PO TBDP: 4.0000 mg | ORAL_TABLET | Freq: Three times a day (TID) | ORAL | 0 refills | Status: DC | PRN

## 2024-05-17 NOTE — Progress Notes (Signed)
 E-Visit for Diarrhea  We are sorry that you are not feeling well.  Here is how we plan to help!  Based on what you have shared with me it looks like you have Acute Infectious Diarrhea.  Most cases of acute diarrhea are due to infections with virus and bacteria and are self-limited conditions lasting less than 14 days.  For your symptoms you may take Imodium 2 mg tablets that are over the counter at your local pharmacy. Take two tablet now and then one after each loose stool up to 6 a day.  Antibiotics are not needed for most people with diarrhea.  I have prescribed  Zofran 4 mg 1 tablet every 8 hours as needed for nausea and vomiting  HOME CARE We recommend changing your diet to help with your symptoms for the next few days. Drink plenty of fluids that contain water salt and sugar. Sports drinks such as Gatorade may help.  You may try broths, soups, bananas, applesauce, soft breads, mashed potatoes or crackers.  You are considered infectious for as long as the diarrhea continues. Hand washing or use of alcohol based hand sanitizers is recommend. It is best to stay out of work or school until your symptoms stop.   GET HELP RIGHT AWAY If you have dark yellow colored urine or do not pass urine frequently you should drink more fluids.   If your symptoms worsen  If you feel like you are going to pass out (faint) You have a new problem  MAKE SURE YOU  Understand these instructions. Will watch your condition. Will get help right away if you are not doing well or get worse.  Thank you for choosing an e-visit.  Your e-visit answers were reviewed by a board certified advanced clinical practitioner to complete your personal care plan. Depending upon the condition, your plan could have included both over the counter or prescription medications.  Please review your pharmacy choice. Make sure the pharmacy is open so you can pick up prescription now. If there is a problem, you may contact your  provider through [employer] and have the prescription routed to another pharmacy.  Your safety is important to [place]. If you have drug allergies check your prescription carefully.   For the next 24 hours you can use MyChart to ask questions about today's visit, request a non-urgent call back, or ask for a work or school excuse. You will get an email in the next two days asking about your experience. I hope that your e-visit has been valuable and will speed your recovery.

## 2024-05-17 NOTE — Progress Notes (Signed)
 I have spent 5 minutes in review of e-visit questionnaire, review and updating patient chart, medical decision making and response to patient.   Piedad Climes, PA-C

## 2024-05-26 ENCOUNTER — Emergency Department (HOSPITAL_COMMUNITY): Payer: MEDICAID

## 2024-05-26 ENCOUNTER — Other Ambulatory Visit: Payer: Self-pay

## 2024-05-26 ENCOUNTER — Encounter (HOSPITAL_COMMUNITY): Payer: Self-pay

## 2024-05-26 ENCOUNTER — Emergency Department (HOSPITAL_COMMUNITY)
Admission: EM | Admit: 2024-05-26 | Discharge: 2024-05-26 | Disposition: A | Discharge status: Home / Self Care | Payer: MEDICAID | Attending: Emergency Medicine | Admitting: Emergency Medicine

## 2024-05-26 DIAGNOSIS — U071 COVID-19: Secondary | ICD-10-CM | POA: Diagnosis not present

## 2024-05-26 DIAGNOSIS — R0602 Shortness of breath: Secondary | ICD-10-CM | POA: Diagnosis present

## 2024-05-26 LAB — HCG, QUANTITATIVE, PREGNANCY: hCG, Beta Chain, Quant, S: <1 m[IU]/mL (ref <5)

## 2024-05-26 LAB — COMPREHENSIVE METABOLIC PANEL WITH GFR
ALT: 11 U/L (ref 0–44)
AST: 16 U/L (ref 15–41)
Albumin: 3.7 g/dL (ref 3.5–5.0)
Alkaline Phosphatase: 48 U/L (ref 38–126)
Anion gap: 9 (ref 5–15)
BUN: <5 mg/dL — ABNORMAL LOW (ref 6–20)
CO2: 22 mmol/L (ref 22–32)
Calcium: 9 mg/dL (ref 8.9–10.3)
Chloride: 105 mmol/L (ref 98–111)
Creatinine, Ser: 0.69 mg/dL (ref 0.44–1.00)
GFR, Estimated: >60 mL/min (ref >60)
Glucose, Bld: 100 mg/dL — ABNORMAL HIGH (ref 70–99)
Potassium: 3.7 mmol/L (ref 3.5–5.1)
Sodium: 136 mmol/L (ref 135–145)
Total Bilirubin: 0.6 mg/dL (ref 0.0–1.2)
Total Protein: 6.9 g/dL (ref 6.5–8.1)

## 2024-05-26 LAB — CBC
HCT: 40.3 % (ref 36.0–46.0)
Hemoglobin: 12.6 g/dL (ref 12.0–15.0)
MCH: 27.4 pg (ref 26.0–34.0)
MCHC: 31.3 g/dL (ref 30.0–36.0)
MCV: 87.6 fL (ref 80.0–100.0)
Platelets: 216 K/uL (ref 150–400)
RBC: 4.6 MIL/uL (ref 3.87–5.11)
RDW: 12.8 % (ref 11.5–15.5)
WBC: 4 K/uL (ref 4.0–10.5)
nRBC: 0 % (ref 0.0–0.2)

## 2024-05-26 LAB — SARS CORONAVIRUS 2 BY RT PCR: SARS Coronavirus 2 by RT PCR: POSITIVE — AB

## 2024-05-26 LAB — D-DIMER, QUANTITATIVE: D-Dimer, Quant: 0.6 ug{FEU}/mL — ABNORMAL HIGH (ref 0.00–0.50)

## 2024-05-26 LAB — TROPONIN I (HIGH SENSITIVITY)
Troponin I (High Sensitivity): <2 ng/L (ref <18)
Troponin I (High Sensitivity): <2 ng/L (ref <18)

## 2024-05-26 MED ORDER — IOHEXOL 350 MG/ML SOLN
75.0000 mL | Freq: Once | INTRAVENOUS | Status: AC | PRN
  Administered 2024-05-26: 75 mL via INTRAVENOUS

## 2024-05-26 MED ORDER — KETOROLAC TROMETHAMINE 15 MG/ML IJ SOLN
15.0000 mg | Freq: Once | INTRAMUSCULAR | Status: AC
  Administered 2024-05-26: 15 mg via INTRAVENOUS
  Filled 2024-05-26: qty 1

## 2024-05-26 MED ORDER — LACTATED RINGERS IV BOLUS
2000.0000 mL | Freq: Once | INTRAVENOUS | Status: AC
  Administered 2024-05-26: 2000 mL via INTRAVENOUS

## 2024-05-26 MED ORDER — ONDANSETRON HCL 4 MG/2ML IJ SOLN
4.0000 mg | Freq: Once | INTRAMUSCULAR | Status: AC
  Administered 2024-05-26: 4 mg via INTRAVENOUS
  Filled 2024-05-26: qty 2

## 2024-05-26 MED ORDER — ONDANSETRON HCL 4 MG PO TABS: 4.0000 mg | ORAL_TABLET | Freq: Four times a day (QID) | ORAL | 0 refills | Status: DC

## 2024-05-26 NOTE — ED Provider Notes (Addendum)
 [facility] EM[facility]GENC[facility] DEPARTMENT AT [facility] Provider Note   CSN: [id number] Arrival date & time: 05/26/24  1207     Patient presents with: Emesis   Tiffany Aguilar is a 23 y.o. female.   23 year old female presents today for concern of being COVID-positive and having shortness of breath, nausea and vomiting.  Had recent positive COVID exposure.  Denies any comorbidities.  Has had multiple episodes of emesis prior to arrival.  1 episode had blood-tinged emesis as well.  No large-volume hematemesis.  The history is provided by the patient. No language interpreter was used.       Prior to Admission medications   Medication Sig Start Date End Date Taking? Authorizing Provider  fluconazole  (DIFLUCAN ) 150 MG tablet Take 1 tablet PO once. Repeat in 3 days if needed. 04/19/24   Gladis Elsie BROCKS, PA-C  ibuprofen  (ADVIL ) 100 MG/5ML suspension Take 20 mLs (400 mg total) by mouth every 6 (six) hours. 02/09/24   Tobie Eldora NOVAK, MD  lidocaine  (XYLOCAINE ) 2 % solution Use as directed 15 mLs in the mouth or throat as needed for mouth pain. 12/19/23   Remi Pippin, NP  metroNIDAZOLE  (FLAGYL ) 500 MG tablet Take 1 tablet (500 mg total) by mouth 2 (two) times daily. 04/19/24   Gladis Elsie BROCKS, PA-C  mupirocin  ointment (BACTROBAN ) 2 % Apply 1 Application topically 2 (two) times daily. 04/12/24   Gladis Elsie BROCKS, PA-C  ondansetron  (ZOFRAN -ODT) 4 MG disintegrating tablet Take 1 tablet (4 mg total) by mouth every 8 (eight) hours as needed for nausea or vomiting. 05/17/24   Gladis Elsie BROCKS, PA-C  oxyCODONE  (ROXICODONE ) 5 MG immediate release tablet Take 1 tablet (5 mg total) by mouth every 4 (four) hours as needed for severe pain (pain score 7-10). 02/09/24   Tobie Eldora NOVAK, MD    Allergies: Blueberry [vaccinium angustifolium] and Kiwi extract    Review of Systems  Constitutional:  Positive for fever.  Respiratory:  Positive for cough and shortness of breath.   Cardiovascular:  Negative for  chest pain.  Gastrointestinal:  Positive for nausea and vomiting. Negative for abdominal pain.  Neurological:  Negative for light-headedness.  All other systems reviewed and are negative.   Updated Vital Signs BP 126/76   Pulse (!) 106   Temp 99.4 F (37.4 C)   Resp 16   Ht 5' 3 (1.6 m)   Wt 56.7 kg   LMP 05/26/2024   SpO2 99%   BMI 22.14 kg/m   Physical Exam Vitals and nursing note reviewed.  Constitutional:      General: She is not in acute distress.    Appearance: Normal appearance. She is not ill-appearing.  HENT:     Head: Normocephalic and atraumatic.     Nose: Nose normal.  Eyes:     Conjunctiva/sclera: Conjunctivae normal.  Cardiovascular:     Rate and Rhythm: Normal rate and regular rhythm.  Pulmonary:     Effort: Pulmonary effort is normal. No respiratory distress.     Breath sounds: Normal breath sounds. No wheezing.  Abdominal:     General: There is no distension.     Palpations: Abdomen is soft.     Tenderness: There is no abdominal tenderness. There is no guarding.  Musculoskeletal:        General: No deformity. Normal range of motion.     Cervical back: Normal range of motion.  Skin:    Findings: No rash.  Neurological:     Mental Status:  She is alert.     (all labs ordered are listed, but only abnormal results are displayed) Labs Reviewed  SARS CORONAVIRUS 2 BY RT PCR - Abnormal; Notable for the following components:      Result Value   SARS Coronavirus 2 by RT PCR POSITIVE (*)    All other components within normal limits  COMPREHENSIVE METABOLIC PANEL WITH GFR - Abnormal; Notable for the following components:   Glucose, Bld 100 (*)    BUN <5 (*)    All other components within normal limits  CBC  D-DIMER, QUANTITATIVE  TROPONIN I (HIGH SENSITIVITY)    EKG: None  Radiology: No results found.   Procedures   Medications Ordered in the ED  ondansetron  (ZOFRAN ) injection 4 mg (has no administration in time range)  ketorolac   (TORADOL ) 15 MG/ML injection 15 mg (has no administration in time range)  lactated ringers  bolus 2,000 mL (2,000 mLs Intravenous New Bag/Given 05/26/24 1646)    Clinical Course as of 05/26/24 2210  Fri May 26, 2024  1815 Patient appears to be doing well.  She is resting comfortably and watching videos on her phone. [AA]  1916 CBC, CMP without acute concern.  Troponin in process.  D-dimer elevated at 0.60.  Will add on CT angio study.  Chest x-ray without acute cardiopulmonary process. Remains comfortable appearing. [AA]    Clinical Course User Index [AA] Hildegard Loge, PA-C                                 Medical Decision Making Amount and/or Complexity of Data Reviewed Labs: ordered. Radiology: ordered.  Risk Prescription drug management.   Medical Decision Making / ED Course   This patient presents to the ED for concern of COVID, emesis, shortness of breath, this involves an extensive number of treatment options, and is a complaint that carries with it a high risk of complications and morbidity.  The differential diagnosis includes COVID infection, pneumonia, myocarditis, pericarditis, ACS, PE  MDM: 23 year old female presents with above-mentioned complaints. Overall well-appearing and hemodynamically stable. Seen multiple times during the ED course where patient is resting comfortably and surfing her phone. CBC unremarkable, CMP without acute concern.  D-dimer was elevated so CT angio chest PE study was ordered.  Chest x-ray does not show any acute cardiopulmonary process.  Troponin negative x 2.  CT angio chest PE study resulted and is negative.  No concerning cause of hematemesis.  Likely Mallory-Weiss tear. Patient discharged in stable condition. She was adequately hydrated in the emergency department.  Was given symptom control.  Discharged in stable condition.  She is agreeable with plan.  Lab Tests: -I ordered, reviewed, and interpreted labs.   The pertinent results  include:   Labs Reviewed  SARS CORONAVIRUS 2 BY RT PCR - Abnormal; Notable for the following components:      Result Value   SARS Coronavirus 2 by RT PCR POSITIVE (*)    All other components within normal limits  COMPREHENSIVE METABOLIC PANEL WITH GFR - Abnormal; Notable for the following components:   Glucose, Bld 100 (*)    BUN <5 (*)    All other components within normal limits  D-DIMER, QUANTITATIVE - Abnormal; Notable for the following components:   D-Dimer, Quant 0.60 (*)    All other components within normal limits  CBC  HCG, QUANTITATIVE, PREGNANCY  TROPONIN I (HIGH SENSITIVITY)  TROPONIN I (HIGH SENSITIVITY)  EKG  EKG Interpretation Date/Time:    Ventricular Rate:    PR Interval:    QRS Duration:    QT Interval:    QTC Calculation:   R Axis:      Text Interpretation:           Imaging Studies ordered: I ordered imaging studies including chest x-ray, CT angio chest PE study I independently visualized and interpreted imaging. I agree with the radiologist interpretation   Medicines ordered and prescription drug management: Meds ordered this encounter  Medications   ondansetron  (ZOFRAN ) injection 4 mg   lactated ringers  bolus 2,000 mL   ketorolac  (TORADOL ) 15 MG/ML injection 15 mg   iohexol  (OMNIPAQUE ) 350 MG/ML injection 75 mL    -I have reviewed the patients home medicines and have made adjustments as needed  Reevaluation: After the interventions noted above, I reevaluated the patient and found that they have :improved  Co morbidities that complicate the patient evaluation  Past Medical History:  Diagnosis Date   Anemia    Anxiety    Chlamydia    Chronic tonsillitis    Depression    Trichomoniasis       Dispostion: Discharged in stable condition.  Return precaution discussed.  Patient voices understanding and is in agreement with plan.   Final diagnoses:  COVID-19    ED Discharge Orders          Ordered    ondansetron  (ZOFRAN )  4 MG tablet  Every 6 hours        05/26/24 2221               Hildegard Loge, PA-C 05/26/24 2224    Hildegard Loge, PA-C 05/26/24 2229    Elnor Hila P, DO 05/26/24 2334

## 2024-05-26 NOTE — Discharge Instructions (Addendum)
 Workup was reassuring.  Chest x-ray CT scan did not show any concerning findings.  Your blood work was reassuring.  Nausea medication sent into pharmacy.  Return for any emergent symptoms otherwise follow-up with your primary care provider.

## 2024-05-26 NOTE — ED Notes (Signed)
 Tried to walk pt to bathroom, when trying to stand pt stated she was too dizzy to walk.

## 2024-05-26 NOTE — Medical Student Note (Cosign Needed Addendum)
 [facility] DEPT Provider Student Note For educational purposes for Medical, PA and NP students only and not part of the legal medical record.   CSN: [id number] Arrival date & time: 05/26/24  1207      History   Chief Complaint Chief Complaint  Patient presents with   Emesis    HPI [name] is a 23 y.o. female presenting today with a chief complaint of emesis. She had a known Covid exposure and tested positive at home. She began feeling symptomatic yesterday morning with fever, chills, productive cough, headache, myalgias, night sweats, and anorexia. She began vomiting today and vomited approximately 6 times prior to arrival. She has only been able to eat some crackers and ginger ale today. She describes a pressure in her chest and noticed blood in her vomit during one episode of emesis. She notes that her vomit has been green in spite of not eating or drinking anything green recently. She describes yellow-tinted sputum as well.    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      Home Medications    Prior to Admission medications   Medication Sig Start Date End Date Taking?  Authorizing Provider  fluconazole  (DIFLUCAN ) 150 MG tablet Take 1 tablet PO once. Repeat in 3 days if needed. 04/19/24   Gladis Elsie BROCKS, PA-C  ibuprofen  (ADVIL ) 100 MG/5ML suspension Take 20 mLs (400 mg total) by mouth every 6 (six) hours. 02/09/24   Tobie Eldora NOVAK, MD  lidocaine  (XYLOCAINE ) 2 % solution Use as directed 15 mLs in the mouth or throat as needed for mouth pain. 12/19/23   Remi Pippin, NP  metroNIDAZOLE  (FLAGYL ) 500 MG tablet Take 1 tablet (500 mg total) by mouth 2 (two) times daily. 04/19/24   Gladis Elsie BROCKS, PA-C  mupirocin  ointment (BACTROBAN ) 2 % Apply 1 Application topically 2 (two) times daily. 04/12/24   Gladis Elsie BROCKS, PA-C  ondansetron  (ZOFRAN -ODT) 4 MG disintegrating tablet Take 1 tablet (4 mg total) by mouth every 8 (eight) hours as needed for nausea or vomiting. 05/17/24   Gladis Elsie BROCKS, PA-C  oxyCODONE  (ROXICODONE ) 5 MG immediate release tablet Take 1 tablet (5 mg total) by mouth every 4 (four) hours as needed for severe pain (pain score 7-10). 02/09/24   Tobie Eldora NOVAK, MD    Family History Family History  Problem Relation Age of Onset   Hypertension Maternal Grandfather     Social History Social History   Tobacco Use   Smoking status: Never   Smokeless tobacco: Never  Vaping Use   Vaping status: Never Used  Substance Use Topics   Alcohol use: Not Currently  Comment: occasionally   Drug use: Never     Allergies   Blueberry [vaccinium angustifolium] and Kiwi extract   Review of Systems Review of Systems  Constitutional:  Positive for chills, fatigue and fever.  HENT:  Positive for rhinorrhea, sinus pressure and sore throat.   Respiratory:  Positive for chest tightness and shortness of breath.   Gastrointestinal:  Positive for constipation, diarrhea and vomiting. Negative for abdominal pain.  Musculoskeletal:  Positive for myalgias.  Neurological:  Positive for light-headedness and headaches.     Physical Exam Updated Vital Signs BP  116/85   Pulse (!) 110   Temp 99.4 F (37.4 C)   Resp 18   Ht 5' 3 (1.6 m)   Wt 56.7 kg   LMP 05/26/2024   SpO2 98%   BMI 22.14 kg/m   Physical Exam Constitutional:      Appearance: Normal appearance.  HENT:     Head: Normocephalic and atraumatic.  Cardiovascular:     Rate and Rhythm: Regular rhythm. Tachycardia present.     Heart sounds: No murmur heard.    No friction rub. No gallop.  Pulmonary:     Effort: Pulmonary effort is normal.     Breath sounds: Normal breath sounds.  Neurological:     Mental Status: She is alert.      ED Treatments / Results  Labs (all labs ordered are listed, but only abnormal results are displayed) Labs Reviewed  SARS CORONAVIRUS 2 BY RT PCR - Abnormal; Notable for the following components:      Result Value   SARS Coronavirus 2 by RT PCR POSITIVE (*)    All other components within normal limits  COMPREHENSIVE METABOLIC PANEL WITH GFR - Abnormal; Notable for the following components:   Glucose, Bld 100 (*)    BUN <5 (*)    All other components within normal limits  CBC    EKG  Radiology No results found.  Procedures Procedures (including critical care time)  Medications Ordered in ED Medications  ondansetron  (ZOFRAN ) injection 4 mg (has no administration in time range)  lactated ringers  bolus 2,000 mL (has no administration in time range)  ketorolac  (TORADOL ) 15 MG/ML injection 15 mg (has no administration in time range)     Initial Impression / Assessment and Plan / ED Course  I have reviewed the triage vital signs and the nursing notes.  Pertinent labs & imaging results that were available during my care of the patient were reviewed by me and considered in my medical decision making (see chart for details).  Clinical Course as of 05/26/24 2107  Fri May 26, 2024  1815 Patient appears to be doing well.  She is resting comfortably and watching videos on her phone. [AA]  1916 CBC, CMP without acute concern.  Troponin in  process.  D-dimer elevated at 0.60.  Will add on CT angio study.  Chest x-ray without acute cardiopulmonary process. Remains comfortable appearing. [AA]    Clinical Course User Index [AA] Hildegard Loge, PA-C  Medical Decision Making / ED Course   This patient presents to the ED for concern of emesis and Covid-19, this involves an extensive number of treatment options, and is a complaint that carries with it a high risk of complications and morbidity. The differential diagnosis includes viral gastroenteritis, influenza, migraine, upper respiratory infection, strep pharyngitis.   MDM: Patient presents with concern of emesis and Covid-19. Known family exposure and positive at-home test. Fluids were administered due to concern for  dehydration. Zofran  was given for nausea, and toradol  was given due to myalgias and minimal relief from Tylenol  at home. Patient expressed concern for being able to care for herself at home since she lives at home.  Due to complaint of shortness of breath, d-dimer was ordered and resulted slightly elevated. CT Angio PE study was negative, VSS, and emesis has improved with fluids and Zofran , so patient is considered safe for discharge.     Additional history obtained: -External records from outside source obtained and reviewed including: Chart review including previous notes, labs, imaging, consultation notes   Lab Tests: -I ordered, reviewed, and interpreted labs.   The pertinent results include:   Labs Reviewed  SARS CORONAVIRUS 2 BY RT PCR - Abnormal; Notable for the following components:      Result Value   SARS Coronavirus 2 by RT PCR POSITIVE (*)    All other components within normal limits  COMPREHENSIVE METABOLIC PANEL WITH GFR - Abnormal; Notable for the following components:   Glucose, Bld 100 (*)    BUN <5 (*)    All other components within normal limits  CBC      EKG   Imaging Studies ordered: I ordered imaging studies including DG Chest 2 View,  CT Angio Chest PE w or wo Contrast I independently visualized and interpreted imaging. I agree with the radiologist interpretation.   Medicines ordered and prescription drug management: Meds ordered this encounter  Medications   ondansetron  (ZOFRAN ) injection 4 mg   lactated ringers  bolus 2,000 mL   ketorolac  (TORADOL ) 15 MG/ML injection 15 mg    -I have reviewed the patients home medicines and have made adjustments as needed   Reevaluation: After the interventions noted above, I reevaluated the patient and found that they have: Improved Patient was observed on multiple occasions resting comfortably in bed, eating and in no acute distress.   Co morbidities that complicate the patient evaluation  Past Medical History:  Diagnosis Date   Anemia    Anxiety    Chlamydia    Chronic tonsillitis    Depression    Trichomoniasis       Dispostion: Admission was considered, but vital signs remain stable, oxygen saturation is maintained at 100% on RA, and patient has been resting comfortably throughout her visit, so it would be best for patient to discharge home and follow up with her PCP.    Final Clinical Impressions(s) / ED Diagnoses   Final diagnoses:  None    New Prescriptions New Prescriptions   No medications on file

## 2024-05-26 NOTE — ED Triage Notes (Signed)
 Pt states she is unable to hold anything down. Was around family who tested positive for COVID recently.

## 2024-06-09 ENCOUNTER — Telehealth: Payer: MEDICAID | Admitting: Physician Assistant

## 2024-06-09 DIAGNOSIS — B9689 Other specified bacterial agents as the cause of diseases classified elsewhere: Secondary | ICD-10-CM

## 2024-06-09 DIAGNOSIS — N760 Acute vaginitis: Secondary | ICD-10-CM | POA: Diagnosis not present

## 2024-06-09 MED ORDER — METRONIDAZOLE 500 MG PO TABS: 500.0000 mg | ORAL_TABLET | Freq: Two times a day (BID) | ORAL | 0 refills | Status: AC

## 2024-06-09 NOTE — Progress Notes (Signed)
 E-Visit for Vaginal Symptoms  We are sorry that you are not feeling well. Here is how we plan to help! Based on what you shared with me it looks like you: May have a vaginosis due to bacteria  Vaginosis is an inflammation of the vagina that can result in discharge, itching and pain. The cause is usually a change in the normal balance of vaginal bacteria or an infection. Vaginosis can also result from reduced estrogen levels after menopause.  The most common causes of vaginosis are:   Bacterial vaginosis which results from an overgrowth of one on several organisms that are normally present in your vagina.   Yeast infections which are caused by a naturally occurring fungus called candida.   Vaginal atrophy (atrophic vaginosis) which results from the thinning of the vagina from reduced estrogen levels after menopause.   Trichomoniasis which is caused by a parasite and is commonly transmitted by sexual intercourse.  Factors that increase your risk of developing vaginosis include: Medications, such as antibiotics and steroids Uncontrolled diabetes Use of hygiene products such as bubble bath, vaginal spray or vaginal deodorant Douching Wearing damp or tight-fitting clothing Using an intrauterine device (IUD) for birth control Hormonal changes, such as those associated with pregnancy, birth control pills or menopause Sexual activity Having a sexually transmitted infection  Your treatment plan is Metronidazole or Flagyl 500mg  twice a day for 7 days.  I have electronically sent this prescription into the pharmacy that you have chosen.  Be sure to take all of the medication as directed. Stop taking any medication if you develop a rash, tongue swelling or shortness of breath. Mothers who are breast feeding should consider pumping and discarding their breast milk while on these antibiotics. However, there is no consensus that infant exposure at these doses would be harmful.  Remember that  medication creams can weaken latex condoms. SABRA   HOME CARE:  Good hygiene may prevent some types of vaginosis from recurring and may relieve some symptoms:  Avoid baths, hot tubs and whirlpool spas. Rinse soap from your outer genital area after a shower, and dry the area well to prevent irritation. Don't use scented or harsh soaps, such as those with deodorant or antibacterial action. Avoid irritants. These include scented tampons and pads. Wipe from front to back after using the toilet. Doing so avoids spreading fecal bacteria to your vagina.  Other things that may help prevent vaginosis include:  Don't douche. Your vagina doesn't require cleansing other than normal bathing. Repetitive douching disrupts the normal organisms that reside in the vagina and can actually increase your risk of vaginal infection. Douching won't clear up a vaginal infection. Use a latex condom. Both female and female latex condoms may help you avoid infections spread by sexual contact. Wear cotton underwear. Also wear pantyhose with a cotton crotch. If you feel comfortable without it, skip wearing underwear to bed. Yeast thrives in [employer] Your symptoms should improve in the next day or two.  GET HELP RIGHT AWAY IF:  You have pain in your lower abdomen ( pelvic area or over your ovaries) You develop nausea or vomiting You develop a fever Your discharge changes or worsens You have persistent pain with intercourse You develop shortness of breath, a rapid pulse, or you faint.  These symptoms could be signs of problems or infections that need to be evaluated by a medical provider now.  MAKE SURE YOU   Understand these instructions. Will watch your condition. Will get help right  away if you are not doing well or get worse.  Thank you for choosing an e-visit.  Your e-visit answers were reviewed by a board certified advanced clinical practitioner to complete your personal care plan. Depending upon the  condition, your plan could have included both over the counter or prescription medications.  Please review your pharmacy choice. Make sure the pharmacy is open so you can pick up prescription now. If there is a problem, you may contact your provider through [employer] and have the prescription routed to another pharmacy.  Your safety is important to us . If you have drug allergies check your prescription carefully.   For the next 24 hours you can use MyChart to ask questions about today's visit, request a non-urgent call back, or ask for a work or school excuse. You will get an email in the next two days asking about your experience. I hope that your e-visit has been valuable and will speed your recovery.  I have spent 5 minutes in review of e-visit questionnaire, review and updating patient chart, medical decision making and response to patient.   Delon CHRISTELLA Dickinson, PA-C

## 2024-06-10 ENCOUNTER — Telehealth: Payer: MEDICAID | Admitting: Nurse Practitioner

## 2024-06-10 DIAGNOSIS — R399 Unspecified symptoms and signs involving the genitourinary system: Secondary | ICD-10-CM

## 2024-06-10 MED ORDER — NITROFURANTOIN MONOHYD MACRO 100 MG PO CAPS: 100.0000 mg | ORAL_CAPSULE | Freq: Two times a day (BID) | ORAL | 0 refills | Status: AC

## 2024-06-10 NOTE — Progress Notes (Signed)
 I have spent 5 minutes in review of e-visit questionnaire, review and updating patient chart, medical decision making and response to patient.   Claiborne Rigg, NP

## 2024-06-10 NOTE — Progress Notes (Signed)

## 2024-06-12 ENCOUNTER — Telehealth: Payer: MEDICAID | Admitting: Physician Assistant

## 2024-06-12 DIAGNOSIS — K047 Periapical abscess without sinus: Secondary | ICD-10-CM

## 2024-06-12 MED ORDER — PENICILLIN V POTASSIUM 500 MG PO TABS: 500.0000 mg | ORAL_TABLET | Freq: Three times a day (TID) | ORAL | 0 refills | Status: AC

## 2024-06-12 NOTE — Progress Notes (Signed)
E-Visit for Dental Pain  We are sorry that you are not feeling well.  Here is how we plan to help!  Based on what you have shared with me in the questionnaire, it sounds like you have a dental infection.  Pen VK 500mg 3 times a day for 7 days  It is imperative that you see a dentist within 10 days of this eVisit to determine the cause of the dental pain and be sure it is adequately treated  A toothache or tooth pain is caused when the nerve in the root of a tooth or surrounding a tooth is irritated. Dental (tooth) infection, decay, injury, or loss of a tooth are the most common causes of dental pain. Pain may also occur after an extraction (tooth is pulled out). Pain sometimes originates from other areas and radiates to the jaw, thus appearing to be tooth pain.Bacteria growing inside your mouth can contribute to gum disease and dental decay, both of which can cause pain. A toothache occurs from inflammation of the central portion of the tooth called pulp. The pulp contains nerve endings that are very sensitive to pain. Inflammation to the pulp or pulpitis may be caused by dental cavities, trauma, and infection.    HOME CARE:   For toothaches: Over-the-counter pain medications such as acetaminophen or ibuprofen may be used. Take these as directed on the package while you arrange for a dental appointment. Avoid very cold or hot foods, because they may make the pain worse. You may get relief from biting on a cotton ball soaked in oil of cloves. You can get oil of cloves at most drug stores.  For jaw pain:  Aspirin may be helpful for problems in the joint of the jaw in adults. If pain happens every time you open your mouth widely, the temporomandibular joint (TMJ) may be the source of the pain. Yawning or taking a large bite of food may worsen the pain. An appointment with your doctor or dentist will help you find the cause.     GET HELP RIGHT AWAY IF:  You have a high fever or chills If you  have had a recent head or face injury and develop headache, light headedness, nausea, vomiting, or other symptoms that concern you after an injury to your face or mouth, you could have a more serious injury in addition to your dental injury. A facial rash associated with a toothache: This condition may improve with medication. Contact your doctor for them to decide what is appropriate. Any jaw pain occurring with chest pain: Although jaw pain is most commonly caused by dental disease, it is sometimes referred pain from other areas. People with heart disease, especially people who have had stents placed, people with diabetes, or those who have had heart surgery may have jaw pain as a symptom of heart attack or angina. If your jaw or tooth pain is associated with lightheadedness, sweating, or shortness of breath, you should see a doctor as soon as possible. Trouble swallowing or excessive pain or bleeding from gums: If you have a history of a weakened immune system, diabetes, or steroid use, you may be more susceptible to infections. Infections can often be more severe and extensive or caused by unusual organisms. Dental and gum infections in people with these conditions may require more aggressive treatment. An abscess may need draining or IV antibiotics, for example.  MAKE SURE YOU   Understand these instructions. Will watch your condition. Will get help right away if   you are not doing well or get worse.  Thank you for choosing an e-visit.  Your e-visit answers were reviewed by a board certified advanced clinical practitioner to complete your personal care plan. Depending upon the condition, your plan could have included both over the counter or prescription medications.  Please review your pharmacy choice. Make sure the pharmacy is open so you can pick up prescription now. If there is a problem, you may contact your provider through MyChart messaging and have the prescription routed to another  pharmacy.  Your safety is important to us. If you have drug allergies check your prescription carefully.   For the next 24 hours you can use MyChart to ask questions about today's visit, request a non-urgent call back, or ask for a work or school excuse. You will get an email in the next two days asking about your experience. I hope that your e-visit has been valuable and will speed your recovery.  I have spent 5 minutes in review of e-visit questionnaire, review and updating patient chart, medical decision making and response to patient.   Yossef Gilkison M Johanny Segers, PA-C  

## 2024-06-20 ENCOUNTER — Telehealth: Payer: MEDICAID | Admitting: Physician Assistant

## 2024-06-20 DIAGNOSIS — A749 Chlamydial infection, unspecified: Secondary | ICD-10-CM

## 2024-06-21 NOTE — Progress Notes (Signed)
  Because we cannot treat STI via e-visit as noted in the questionnaire, I feel your condition warrants further evaluation and I recommend that you be seen in a face-to-face visit. Since you note testing positive, you should call the office who performed the testing as since you already had a visit with them, they should be able to provide the treatment for you without need for additional appointment.    NOTE: There will be NO CHARGE for this E-Visit   If you are having a true medical emergency, please call 911.     For an urgent face to face visit, [facility] has multiple urgent care centers for your convenience.  Click the link below for the full list of locations and hours, walk-in wait times, appointment scheduling options and driving directions:  Urgent Care - [place], [place], [place], [place], [place], [place]  [place]     Your MyChart E-visit questionnaire answers were reviewed by a board certified advanced clinical practitioner to complete your personal care plan based on your specific symptoms.    Thank you for using e-Visits.

## 2024-06-25 ENCOUNTER — Encounter (HOSPITAL_COMMUNITY): Payer: Self-pay

## 2024-06-25 ENCOUNTER — Ambulatory Visit (HOSPITAL_COMMUNITY)
Admission: EM | Admit: 2024-06-25 | Discharge: 2024-06-25 | Disposition: A | Discharge status: Home / Self Care | Payer: MEDICAID | Attending: Internal Medicine | Admitting: Internal Medicine

## 2024-06-25 DIAGNOSIS — R300 Dysuria: Secondary | ICD-10-CM | POA: Diagnosis present

## 2024-06-25 DIAGNOSIS — N898 Other specified noninflammatory disorders of vagina: Secondary | ICD-10-CM | POA: Insufficient documentation

## 2024-06-25 LAB — POCT URINALYSIS DIP (MANUAL ENTRY)
Glucose, UA: NEGATIVE mg/dL
Nitrite, UA: NEGATIVE
Protein Ur, POC: 30 mg/dL — AB
Spec Grav, UA: >=1.03 — AB (ref 1.010–1.025)
Urobilinogen, UA: 0.2 U/dL
pH, UA: 5.5 (ref 5.0–8.0)

## 2024-06-25 LAB — POCT URINE PREGNANCY: Preg Test, Ur: NEGATIVE

## 2024-06-25 MED ORDER — FLUCONAZOLE 100 MG PO TABS: 100.0000 mg | ORAL_TABLET | Freq: Every day | ORAL | 0 refills | Status: DC

## 2024-06-25 NOTE — ED Triage Notes (Signed)
 Patient reports that she was diagnosed with chlamydia on 8/25 and took Azithromycin on 06/23/24. Patient states she was also diagnosed with a yeast infection/BV on 8/29. Patient states she took Flucanozole on 06/23/24 and Flagyl  she began on 06/20/24.  Patient states she is still having vaginal itching, vaginal swelling, a white vaginal discharge, and dysuria.

## 2024-06-25 NOTE — ED Provider Notes (Signed)
 [facility]    CSN: [id number] Arrival date & time: 06/25/24  1017      History   Chief Complaint Chief Complaint  Patient presents with   Dysuria   Vaginal Discharge   Groin Swelling    HPI Tiffany Aguilar is a 23 y.o. female.   23 year old female presents urgent care with complaints of severe vaginal itching and thick whitish discharge.  This is also accompanied by dysuria.  In the past several months the patient has had a pregnancy with an SAB followed by diagnosis with COVID and most recently diagnosis of chlamydia, BV and yeast.  She finished treatment for the yeast and chlamydia on Friday and has just finished the treatment for BV Saturday.  She reports that she continues to have severe itching in the vaginal area as well as a thick whitish discharge.  She denies any pelvic pain, fevers, chills, nausea, vomiting.  She reports that the last 2 yeast infections she has had have been very severe.   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      Home Medications    Prior to  Admission medications   Medication Sig Start Date End Date Taking? Authorizing Provider  fluconazole  (DIFLUCAN ) 100 MG tablet Take 1 tablet (100 mg total) by mouth daily. 06/25/24  Yes Jaleisa Brose A, PA-C  ibuprofen  (ADVIL ) 100 MG/5ML suspension Take 20 mLs (400 mg total) by mouth every 6 (six) hours. Patient not taking: Reported on 06/25/2024 02/09/24   Tobie Eldora NOVAK, MD  lidocaine  (XYLOCAINE ) 2 % solution Use as directed 15 mLs in the mouth or throat as needed for mouth pain. Patient not taking: Reported on 06/25/2024 12/19/23   Remi Pippin, NP  mupirocin  ointment (BACTROBAN ) 2 % Apply 1 Application topically 2 (two) times daily. Patient not taking: Reported on 06/25/2024 04/12/24   Gladis Elsie BROCKS, PA-C  ondansetron  (ZOFRAN ) 4 MG tablet Take 1 tablet (4 mg total) by mouth every 6 (six) hours. Patient not taking: Reported on 06/25/2024 05/26/24   Hildegard Loge, PA-C  ondansetron  (ZOFRAN -ODT) 4 MG disintegrating tablet Take 1 tablet (4 mg total) by mouth every 8 (eight) hours as needed for nausea or vomiting. Patient not taking: Reported on 06/25/2024 05/17/24   Gladis Elsie BROCKS, PA-C  oxyCODONE  (ROXICODONE ) 5 MG immediate release tablet Take 1 tablet (5 mg total) by mouth every 4 (four) hours as needed for severe pain (pain score 7-10). Patient not taking: Reported on 06/25/2024 02/09/24   Tobie Eldora NOVAK, MD    Family History  Family History  Problem Relation Age of Onset   Hypertension Maternal Grandfather     Social History Social History   Tobacco Use   Smoking status: Never   Smokeless tobacco: Never  Vaping Use   Vaping status: Never Used  Substance Use Topics   Alcohol use: Yes    Comment: occasionally   Drug use: Never     Allergies   Blueberry [vaccinium angustifolium] and Kiwi extract   Review of Systems Review of Systems  Constitutional:  Negative for chills and fever.  HENT:  Negative for ear pain and sore throat.   Eyes:  Negative for pain and visual disturbance.   Respiratory:  Negative for cough and shortness of breath.   Cardiovascular:  Negative for chest pain and palpitations.  Gastrointestinal:  Negative for abdominal pain and vomiting.  Genitourinary:  Positive for dysuria and vaginal discharge. Negative for hematuria.       Significant vaginal itching and some swelling  Musculoskeletal:  Negative for arthralgias and back pain.  Skin:  Negative for color change and rash.  Neurological:  Negative for seizures and syncope.  All other systems reviewed and are negative.    Physical Exam Triage Vital Signs ED Triage Vitals  Encounter Vitals Group     BP 06/25/24 1103 112/79     Girls Systolic BP Percentile --      Girls Diastolic BP Percentile --      Boys Systolic BP Percentile --      Boys Diastolic BP Percentile --      Pulse Rate 06/25/24 1103 75     Resp 06/25/24 1103 14     Temp 06/25/24 1103 98.3 F (36.8 C)     Temp Source 06/25/24 1103 Oral     SpO2 06/25/24 1103 96 %     Weight --      Height --      Head Circumference --      Peak Flow --      Pain Score 06/25/24 1104 8     Pain Loc --      Pain Education --      Exclude from Growth Chart --    No data found.  Updated Vital Signs BP 112/79 (BP Location: Right Arm)   Pulse 75   Temp 98.3 F (36.8 C) (Oral)   Resp 14   LMP 06/15/2024 (Approximate)   SpO2 96%   Visual Acuity Right Eye Distance:   Left Eye Distance:   Bilateral Distance:    Right Eye Near:   Left Eye Near:    Bilateral Near:     Physical Exam Vitals and nursing note reviewed.  Constitutional:      General: She is not in acute distress.    Appearance: She is well-developed.  HENT:     Head: Normocephalic and atraumatic.  Eyes:     Conjunctiva/sclera: Conjunctivae normal.  Cardiovascular:     Rate and Rhythm: Normal rate and regular rhythm.     Heart sounds: No murmur heard. Pulmonary:     Effort: Pulmonary effort is normal. No respiratory distress.     Breath sounds: Normal breath  sounds.  Abdominal:     Palpations: Abdomen is soft.     Tenderness: There is no abdominal tenderness.  Musculoskeletal:        General: No swelling.     Cervical back: Neck supple.  Skin:    General: Skin is warm and dry.     Capillary Refill:  Capillary refill takes less than 2 seconds.  Neurological:     Mental Status: She is alert.  Psychiatric:        Mood and Affect: Mood normal.      UC Treatments / Results  Labs (all labs ordered are listed, but only abnormal results are displayed) Labs Reviewed  POCT URINALYSIS DIP (MANUAL ENTRY) - Abnormal; Notable for the following components:      Result Value   Color, UA straw (*)    Bilirubin, UA small (*)    Ketones, POC UA trace (5) (*)    Spec Grav, UA >=1.030 (*)    Blood, UA trace-intact (*)    Protein Ur, POC =30 (*)    Leukocytes, UA Small (1+) (*)    All other components within normal limits  URINE CULTURE  POCT URINE PREGNANCY  CERVICOVAGINAL ANCILLARY ONLY    EKG   Radiology No results found.  Procedures Procedures (including critical care time)  Medications Ordered in UC Medications - No data to display  Initial Impression / Assessment and Plan / UC Course  I have reviewed the triage vital signs and the nursing notes.  Pertinent labs & imaging results that were available during my care of the patient were reviewed by me and considered in my medical decision making (see chart for details).     Dysuria - Plan: POC urinalysis dipstick, POC urinalysis dipstick  Vaginal discharge - Plan: POCT urine pregnancy, POCT urine pregnancy  Vagina itching   Urinalysis done today shows possible urinary tract infection.  We will send the urine off for culture and sensitivity to verify that the antibiotics will be effective.  There are persistent symptoms of a yeast infection and we will try an extended treatment with Diflucan  but will also do a vaginal swab today to see if there is a resistant version of yeast  present.  The results for this will take approximately 24 to 48 hours and we will contact you if there is any positive results or if any treatment needs to be modified. Start 06/26/24: Diflucan  100 mg daily for 5 days. This is a medication to treat yeast infection.  Start today Macrobid  100 mg twice daily. This is an antibiotic.  Make sure to stay hydrated by drinking plenty of water. Return to urgent care or PCP if symptoms worsen or fail to resolve.    Final Clinical Impressions(s) / UC Diagnoses   Final diagnoses:  Dysuria  Vaginal discharge  Vagina itching     Discharge Instructions      Urinalysis done today shows possible urinary tract infection.  We will send the urine off for culture and sensitivity to verify that the antibiotics will be effective.  There are persistent symptoms of a yeast infection and we will try an extended treatment with Diflucan  but will also do a vaginal swab today to see if there is a resistant version of yeast present.  The results for this will take approximately 24 to 48 hours and we will contact you if there is any positive results or if any treatment needs to be modified. Start 06/26/24: Diflucan  100 mg daily for 5 days. This is a medication to treat yeast infection.  Start today Macrobid  100 mg twice daily. This is an antibiotic.  Make sure to stay hydrated by drinking plenty of water. Return to urgent care or PCP if symptoms worsen or fail to resolve.      ED Prescriptions     Medication Sig  Dispense Auth. Provider   fluconazole  (DIFLUCAN ) 100 MG tablet Take 1 tablet (100 mg total) by mouth daily. 5 tablet Teresa Almarie LABOR, PA-C      PDMP not reviewed this encounter.   Teresa Almarie LABOR, PA-C 06/25/24 1131

## 2024-06-25 NOTE — Discharge Instructions (Addendum)
 Urinalysis done today shows possible urinary tract infection.  We will send the urine off for culture and sensitivity to verify that the antibiotics will be effective.  There are persistent symptoms of a yeast infection and we will try an extended treatment with Diflucan  but will also do a vaginal swab today to see if there is a resistant version of yeast present.  The results for this will take approximately 24 to 48 hours and we will contact you if there is any positive results or if any treatment needs to be modified. Start 06/26/24: Diflucan  100 mg daily for 5 days. This is a medication to treat yeast infection.  Start today Macrobid  100 mg twice daily. This is an antibiotic.  Make sure to stay hydrated by drinking plenty of water. Return to urgent care or PCP if symptoms worsen or fail to resolve.

## 2024-06-26 ENCOUNTER — Ambulatory Visit (HOSPITAL_COMMUNITY): Payer: Self-pay

## 2024-06-26 LAB — CERVICOVAGINAL ANCILLARY ONLY
Bacterial Vaginitis (gardnerella): NEGATIVE
Candida Glabrata: NEGATIVE
Candida Vaginitis: POSITIVE — AB
Comment: NEGATIVE
Comment: NEGATIVE
Comment: NEGATIVE

## 2024-06-26 LAB — URINE CULTURE: Culture: 3000 — AB

## 2024-07-08 ENCOUNTER — Telehealth: Payer: MEDICAID | Admitting: Nurse Practitioner

## 2024-07-08 DIAGNOSIS — K120 Recurrent oral aphthae: Secondary | ICD-10-CM | POA: Diagnosis not present

## 2024-07-08 MED ORDER — CHLORHEXIDINE GLUCONATE 0.12 % MT SOLN: 15.0000 mL | Freq: Two times a day (BID) | OROMUCOSAL | 0 refills | Status: DC

## 2024-07-08 NOTE — Progress Notes (Signed)
 You can a canker sore in your mouth. I have sent an antibacterial mouthrinse to the pharmacy. You can use oragel over the counter for the pain as well   HOME CARE:  Wash your hands frequently. Do not pick at or rub the sore. Don't open the blisters. Avoid kissing other people during this time. Avoid sharing drinking glasses, eating utensils, or razors. Do not handle contact lenses unless you have thoroughly washed your hands with soap and warm water! Avoid oral sex during this time.  Herpes from sores on your mouth can spread to your partner's genital area. Avoid contact with anyone who has eczema or a weakened immune system. Cold sores are often triggered by exposure to intense sunlight, use a lip balm containing a sunscreen (SPF 30 or higher).  GET HELP RIGHT AWAY IF:  Blisters look infected. Blisters occur near or in the eye. Symptoms last longer than 10 days. Your symptoms become worse.  MAKE SURE YOU:  Understand these instructions. Will watch your condition. Will get help right away if you are not doing well or get worse.    Your e-visit answers were reviewed by a board certified advanced clinical practitioner to complete your personal care plan.  Depending upon the condition, your plan could have  Included both over the counter or prescription medications.    Please review your pharmacy choice.  Be sure that the pharmacy you have chosen is open so that you can pick up your prescription now.  If there is a problem you can message your provider in MyChart to have the prescription routed to another pharmacy.    Your safety is important to us .  If you have drug allergies check our prescription carefully.  For the next 24 hours you can use MyChart to ask questions about today's visit, request a non-urgent call back, or ask for a work or school excuse from your e-visit provider.  You will get an email in the next two days asking about your experience.  I hope that your e-visit  has been valuable and will speed your recovery.

## 2024-07-08 NOTE — Progress Notes (Signed)
 I have spent 5 minutes in review of e-visit questionnaire, review and updating patient chart, medical decision making and response to patient.   Claiborne Rigg, NP

## 2024-07-30 ENCOUNTER — Telehealth: Payer: MEDICAID | Admitting: Physician Assistant

## 2024-07-30 DIAGNOSIS — R3989 Other symptoms and signs involving the genitourinary system: Secondary | ICD-10-CM

## 2024-07-31 MED ORDER — CEPHALEXIN 500 MG PO CAPS: 500.0000 mg | ORAL_CAPSULE | Freq: Two times a day (BID) | ORAL | 0 refills | Status: AC

## 2024-07-31 NOTE — Progress Notes (Signed)

## 2024-08-19 ENCOUNTER — Telehealth: Payer: MEDICAID | Admitting: Family Medicine

## 2024-08-19 DIAGNOSIS — B3731 Acute candidiasis of vulva and vagina: Secondary | ICD-10-CM | POA: Diagnosis not present

## 2024-08-19 DIAGNOSIS — R399 Unspecified symptoms and signs involving the genitourinary system: Secondary | ICD-10-CM

## 2024-08-19 MED ORDER — CEPHALEXIN 500 MG PO CAPS: 500.0000 mg | ORAL_CAPSULE | Freq: Two times a day (BID) | ORAL | 0 refills | Status: AC

## 2024-08-19 MED ORDER — FLUCONAZOLE 150 MG PO TABS: 150.0000 mg | ORAL_TABLET | Freq: Every day | ORAL | 0 refills | Status: AC

## 2024-08-19 NOTE — Progress Notes (Signed)

## 2024-08-21 ENCOUNTER — Telehealth: Payer: MEDICAID | Admitting: Physician Assistant

## 2024-08-21 DIAGNOSIS — B3731 Acute candidiasis of vulva and vagina: Secondary | ICD-10-CM

## 2024-08-21 NOTE — Progress Notes (Signed)
  Because you were seen on 10/31 and your symptoms aren't improving, I feel your condition warrants further evaluation and I recommend that you be seen in a face-to-face visit.   NOTE: There will be NO CHARGE for this E-Visit   If you are having a true medical emergency, please call 911.     For an urgent face to face visit, [place] has multiple urgent care centers for your convenience.  Click the link below for the full list of locations and hours, walk-in wait times, appointment scheduling options and driving directions:  Urgent Care - [place], [place], [place], [place], [place], [place]  [place]     Your MyChart E-visit questionnaire answers were reviewed by a board certified advanced clinical practitioner to complete your personal care plan based on your specific symptoms.    Thank you for using e-Visits.

## 2024-09-01 ENCOUNTER — Telehealth: Payer: MEDICAID | Admitting: Physician Assistant

## 2024-09-01 DIAGNOSIS — B9689 Other specified bacterial agents as the cause of diseases classified elsewhere: Secondary | ICD-10-CM

## 2024-09-01 DIAGNOSIS — N760 Acute vaginitis: Secondary | ICD-10-CM | POA: Diagnosis not present

## 2024-09-01 MED ORDER — METRONIDAZOLE 0.75 % VA GEL: 1.0000 | Freq: Every day | VAGINAL | 0 refills | Status: AC

## 2024-09-01 NOTE — Progress Notes (Signed)
 We are sorry that you are not feeling well. Here is how we plan to help! Based on what you shared with me it looks like you: May have a vaginosis due to bacteria  Vaginosis is an inflammation of the vagina that can result in discharge, itching and pain. The cause is usually a change in the normal balance of vaginal bacteria or an infection. Vaginosis can also result from reduced estrogen levels after menopause.  The most common causes of vaginosis are:   Bacterial vaginosis which results from an overgrowth of one on several organisms that are normally present in your vagina.   Yeast infections which are caused by a naturally occurring fungus called candida.   Vaginal atrophy (atrophic vaginosis) which results from the thinning of the vagina from reduced estrogen levels after menopause.   Trichomoniasis which is caused by a parasite and is commonly transmitted by sexual intercourse.  Factors that increase your risk of developing vaginosis include: Medications, such as antibiotics and steroids Uncontrolled diabetes Use of hygiene products such as bubble bath, vaginal spray or vaginal deodorant Douching Wearing damp or tight-fitting clothing Using an intrauterine device (IUD) for birth control Hormonal changes, such as those associated with pregnancy, birth control pills or menopause Sexual activity Having a sexually transmitted infection  Your treatment plan is Metronidazole  Vaginal gel Use one applicatorful at bedtime for 7 days.  I have electronically sent this prescription into the pharmacy that you have chosen.  Be sure to take all of the medication as directed. Stop taking any medication if you develop a rash, tongue swelling or shortness of breath. Mothers who are breast feeding should consider pumping and discarding their breast milk while on these antibiotics. However, there is no consensus that infant exposure at these doses would be harmful.  Remember that medication creams can  weaken latex condoms.   HOME CARE:  Good hygiene may prevent some types of vaginosis from recurring and may relieve some symptoms:  Avoid baths, hot tubs and whirlpool spas. Rinse soap from your outer genital area after a shower, and dry the area well to prevent irritation. Don't use scented or harsh soaps, such as those with deodorant or antibacterial action. Avoid irritants. These include scented tampons and pads. Wipe from front to back after using the toilet. Doing so avoids spreading fecal bacteria to your vagina.  Other things that may help prevent vaginosis include:  Don't douche. Your vagina doesn't require cleansing other than normal bathing. Repetitive douching disrupts the normal organisms that reside in the vagina and can actually increase your risk of vaginal infection. Douching won't clear up a vaginal infection. Use a latex condom. Both female and female latex condoms may help you avoid infections spread by sexual contact. Wear cotton underwear. Also wear pantyhose with a cotton crotch. If you feel comfortable without it, skip wearing underwear to bed. Yeast thrives in [facility] Your symptoms should improve in the next day or two.  GET HELP RIGHT AWAY IF:  You have pain in your lower abdomen ( pelvic area or over your ovaries) You develop nausea or vomiting You develop a fever Your discharge changes or worsens You have persistent pain with intercourse You develop shortness of breath, a rapid pulse, or you faint.  These symptoms could be signs of problems or infections that need to be evaluated by a medical provider now.  MAKE SURE YOU   Understand these instructions. Will watch your condition. Will get help right away if you are not  doing well or get worse.  Your e-visit answers were reviewed by a board certified advanced clinical practitioner to complete your personal care plan. Depending upon the condition, your plan could have included both over the counter  or prescription medications. Please review your pharmacy choice to make sure that you have choses a pharmacy that is open for you to pick up any needed prescription, Your safety is important to us . If you have drug allergies check your prescription carefully.   You can use MyChart to ask questions about today's visit, request a non-urgent call back, or ask for a work or school excuse for 24 hours related to this e-Visit. If it has been greater than 24 hours you will need to follow up with your provider, or enter a new e-Visit to address those concerns. You will get a MyChart message within the next two days asking about your experience. I hope that your e-visit has been valuable and will speed your recovery.  I have spent 5 minutes in review of e-visit questionnaire, review and updating patient chart, medical decision making and response to patient.   Delon CHRISTELLA Dickinson, PA-C

## 2024-09-04 ENCOUNTER — Encounter (HOSPITAL_COMMUNITY): Payer: Self-pay | Admitting: Emergency Medicine

## 2024-09-04 ENCOUNTER — Ambulatory Visit (HOSPITAL_COMMUNITY)
Admission: EM | Admit: 2024-09-04 | Discharge: 2024-09-04 | Disposition: A | Discharge status: Home / Self Care | Payer: MEDICAID | Attending: Internal Medicine | Admitting: Internal Medicine

## 2024-09-04 ENCOUNTER — Other Ambulatory Visit: Payer: Self-pay

## 2024-09-04 DIAGNOSIS — Z3202 Encounter for pregnancy test, result negative: Secondary | ICD-10-CM | POA: Diagnosis not present

## 2024-09-04 DIAGNOSIS — Z9189 Other specified personal risk factors, not elsewhere classified: Secondary | ICD-10-CM | POA: Diagnosis present

## 2024-09-04 LAB — POCT URINE PREGNANCY: Preg Test, Ur: NEGATIVE

## 2024-09-04 NOTE — ED Provider Notes (Signed)
 [facility]    CSN: [id number] Arrival date & time: 09/04/24  1517      History   Chief Complaint Chief Complaint  Patient presents with   SEXUALLY TRANSMITTED DISEASE    HPI Tiffany Aguilar is a 23 y.o. female.   Tiffany Aguilar is a 23 y.o. female presenting for chief complaint of vaginal discharge that started today.  Vaginal discharge is yellow and associated with odor.  She denies vaginal itching and known exposure to STDs.  She is currently taking a big red capsule for UTI.  She is unsure of the name of this antibiotic but states it is helping with her urinary symptoms.  She was treated for UTI at a different clinic.  She is concerned that she may be pregnant.  Her last menstrual cycle started on August 27, 2024.  She had several days of nausea with vomiting prior to the onset of her menstrual cycle then had 3 days of heavy bleeding and 2-3 more days of very light bleeding without brown spotting at the end of her period which is atypical for her.  History of miscarriage with similar presentation.  She took a pregnancy test at home and felt there to be a very faint line on the pregnancy test causing concern that she may be pregnant or may have miscarried.  She denies pelvic pain, current nausea/vomiting, dizziness, current vaginal bleeding, low back pain, flank pain, headache, and fever/chills.  Of note, patient did an e-visit on September 01, 2024 where she was prescribed MetroGel  for BV.  She has not started this medication as she wanted to come into the clinic to be tested for vaginal infections before starting treatment to ensure that we are treating the right cause of her symptoms.     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      Home Medications    Prior to Admission medications   Medication Sig Start Date End Date Taking? Authorizing Provider  chlorhexidine  (PERIDEX ) 0.12 % solution Use as directed 15 mLs in the mouth or throat 2 (two) times daily. 07/08/24   Fleming, Zelda W, NP  fluconazole  (DIFLUCAN ) 100 MG tablet Take 1 tablet (100 mg total) by mouth daily. 06/25/24   White, Elizabeth A, PA-C  ibuprofen  (ADVIL ) 100 MG/5ML suspension Take 20 mLs (400 mg total) by mouth every 6 (six) hours. Patient not taking: Reported on 06/25/2024 02/09/24   Tobie Eldora NOVAK, MD  lidocaine  (XYLOCAINE ) 2 % solution Use as directed 15 mLs in the mouth or throat as needed for mouth pain. Patient not taking: Reported on 06/25/2024 12/19/23   Remi Pippin, NP  metroNIDAZOLE  (METROGEL ) 0.75 % vaginal gel Place 1 Applicatorful vaginally at bedtime for 7 days. 09/01/24 09/08/24  Vivienne Delon HERO, PA-C  oxyCODONE  (ROXICODONE ) 5 MG immediate release tablet Take 1 tablet (5 mg total) by mouth every 4 (four) hours as needed for severe pain (pain score 7-10). Patient not taking: Reported on 06/25/2024 02/09/24   Tobie Eldora NOVAK, MD    Family History Family History  Problem Relation Age of Onset   Hypertension Maternal Grandfather     Social History Social History   Tobacco Use   Smoking status: Never   Smokeless tobacco: Never  Vaping Use   Vaping status: Never Used  Substance Use Topics   Alcohol use: Yes    Comment: occasionally   Drug use: Never     Allergies   Blueberry [vaccinium angustifolium] and Kiwi  extract   Review of Systems Review of Systems Per HPI  Physical Exam Triage Vital Signs ED Triage Vitals  Encounter Vitals Group     BP 09/04/24 1800 106/70     Girls Systolic BP Percentile --      Girls Diastolic BP Percentile --      Boys Systolic BP Percentile --      Boys Diastolic BP Percentile --      Pulse Rate 09/04/24 1800 84     Resp 09/04/24 1800 18     Temp 09/04/24 1800 98.4 F (36.9 C)     Temp Source 09/04/24 1800 Oral     SpO2 09/04/24 1800 99 %     Weight --      Height --      Head Circumference --      Peak Flow --      Pain Score 09/04/24 1757 4     Pain Loc --      Pain Education --      Exclude from Growth Chart --    No data found.  Updated Vital Signs BP 106/70 (BP Location: Left Arm)   Pulse 84   Temp 98.4 F (36.9 C) (Oral)   Resp 18   LMP 08/27/2024 (Approximate)   SpO2 99%   Visual Acuity Right Eye Distance:   Left Eye Distance:   Bilateral Distance:    Right Eye Near:   Left Eye Near:    Bilateral Near:     Physical Exam Vitals and nursing note reviewed.  Constitutional:      Appearance: She is not ill-appearing or toxic-appearing.  HENT:     Head: Normocephalic and atraumatic.     Right Ear: Hearing and external ear normal.     Left Ear: Hearing and external ear normal.     Nose: Nose normal.     Mouth/Throat:     Lips: Pink.  Eyes:     General: Lids are normal. Vision grossly intact. Gaze aligned appropriately.     Extraocular Movements: Extraocular movements intact.     Conjunctiva/sclera: Conjunctivae normal.  Pulmonary:     Effort: Pulmonary effort is normal.  Abdominal:     General: Bowel sounds are normal.     Palpations: Abdomen is soft.     Tenderness: There is no abdominal tenderness. There is no right CVA tenderness, left CVA tenderness or guarding.  Musculoskeletal:     Cervical back: Neck supple.  Skin:    General: Skin is warm and dry.     Capillary Refill: Capillary refill takes less than 2  seconds.     Findings: No rash.  Neurological:     General: No focal deficit present.     Mental Status: She is alert and oriented to person,  place, and time. Mental status is at baseline.     Cranial Nerves: No dysarthria or facial asymmetry.  Psychiatric:        Mood and Affect: Mood normal.        Speech: Speech normal.        Behavior: Behavior normal.        Thought Content: Thought content normal.        Judgment: Judgment normal.      UC Treatments / Results  Labs (all labs ordered are listed, but only abnormal results are displayed) Labs Reviewed  HIV ANTIBODY (ROUTINE TESTING W REFLEX)  RPR  POCT URINE PREGNANCY  CERVICOVAGINAL ANCILLARY ONLY    EKG   Radiology No results found.  Procedures Procedures (including critical care time)  Medications Ordered in UC Medications - No data to display  Initial Impression / Assessment and Plan / UC Course  I have reviewed the triage vital signs and the nursing notes.  Pertinent labs & imaging results that were available during my care of the patient were reviewed by me and considered in my medical decision making (see chart for details).   1.  At risk for STD due to unprotected sex, negative pregnancy test STI labs pending, will notify patient of positive results and treat accordingly per protocol when labs result.  Patient would like HIV and syphilis testing today.   Patient to avoid sexual intercourse until screening testing comes back.   Education provided regarding safe sexual practices and patient encouraged to use protection to prevent spread of STIs.   Pregnancy test is negative.   Counseled patient on potential for adverse effects with medications prescribed/recommended today, strict ER and return-to-clinic precautions discussed, patient verbalized understanding.    Final Clinical Impressions(s) / UC Diagnoses   Final diagnoses:  At risk for sexually transmitted disease due to unprotected sex  Negative  pregnancy test     Discharge Instructions      STD testing pending, this will take 2-3 days to result. We will only call you if your testing is positive for any infection(s) and we will provide treatment.  Avoid sexual intercourse until your STD results come back.  If any of your STD results are positive, you will need to avoid sexual intercourse for 7 days while you are being treated to prevent spread of STD.  Condom use is the best way to prevent spread of STDs. Notify partner(s) of any positive results.  Return to urgent care as needed.       ED Prescriptions   None    PDMP not reviewed this encounter.   Enedelia Dorna HERO, [place] 09/04/24 1844

## 2024-09-04 NOTE — ED Triage Notes (Signed)
 Noticed a yellow discharge this morning.  Period ended 3 days ago.  This period was late:  11/9-11/13.  Complains of lower abdominal cramping.  Patient reports she is being treated for uti Taking a red pill for uti BID, for 7 days.  Has been taking this for 4 days

## 2024-09-04 NOTE — Discharge Instructions (Signed)

## 2024-09-05 LAB — RPR: RPR Ser Ql: NONREACTIVE

## 2024-09-06 ENCOUNTER — Ambulatory Visit (HOSPITAL_COMMUNITY): Payer: Self-pay

## 2024-09-06 LAB — CERVICOVAGINAL ANCILLARY ONLY
Bacterial Vaginitis (gardnerella): POSITIVE — AB
Chlamydia: NEGATIVE
Comment: NEGATIVE
Comment: NEGATIVE
Comment: NEGATIVE
Comment: NEGATIVE
Comment: NEGATIVE
Comment: NORMAL
Neisseria Gonorrhea: NEGATIVE

## 2024-09-06 LAB — MISC LABCORP TEST (SEND OUT): Labcorp test code: 83935

## 2024-09-09 ENCOUNTER — Telehealth: Payer: MEDICAID | Admitting: Nurse Practitioner

## 2024-09-09 DIAGNOSIS — L709 Acne, unspecified: Secondary | ICD-10-CM

## 2024-09-10 MED ORDER — BENZOYL PEROXIDE-ERYTHROMYCIN 5-3 % EX GEL: Freq: Two times a day (BID) | CUTANEOUS | 0 refills | Status: DC

## 2024-09-10 NOTE — Progress Notes (Signed)
 We are sorry that you are experiencing this issue.  Here is how we plan to help!  Please follow up with your PCP so that he can refer you to a dermatologist as you will likely require more intensive treatment for your acne that has scarred.   Based on what you shared with me it looks like you have severe acne.  Acne is a disorder of the hair follicles and oil glands (sebaceous glands). The sebaceous glands secrete oils to keep the skin moist.  When the glands get clogged, it can lead to pimples or cysts.  These cysts may become infected and leave scars. Acne is very common and normally occurs at puberty.  Acne is also inherited.  Your personal care plan consists of the following recommendations:  I recommend that you use a daily cleanser  You might try 2% topical salicylic acid pads or wipes.  Use the pads to daily cleanse your skin.  I have prescribed a topical gel with an antibiotic:  Benzoyl peroxide -erythromycin  gel.  This gel should be applied to the affected areas twice a day. Be sure to read the package insert for potential side effects.  If excessive dryness or peeling occurs, reduce dose frequency or concentration of the topical scrubs.  If excessive stinging or burning occurs, remove the topical gel with mild soap and water and resume at a lower dose the next day.  Remember oral antibiotics and topical acne treatments may increase your sensitivity to the sun!  HOME CARE: Do not squeeze pimples because that can often lead to infections, worse acne, and scars. Use a moisturizer that contains retinoid or fruit acids that may inhibit the development of new acne lesions. Although there is not a clear link that foods can cause acne, doctors do believe that too many sweets predispose you to skin problems.  GET HELP RIGHT AWAY IF: If your acne gets worse or is not better within 10 days. If you become depressed. If you become pregnant, discontinue medications and call your OB/GYN.  MAKE  SURE YOU: Understand these instructions. Will watch your condition. Will get help right away if you are not doing well or get worse.   Your e-visit answers were reviewed by a board certified advanced clinical practitioner to complete your personal care plan.  Depending upon the condition, your plan could have included both over the counter or prescription medications.  Please review your pharmacy choice.  If there is a problem, you may contact your provider through [employer] and have the prescription routed to another pharmacy.  Your safety is important to us .  If you have drug allergies check your prescription carefully.  For the next 24 hours you can use MyChart to ask questions about today's visit, request a non-urgent call back, or ask for a work or school excuse from your e-visit provider.  You will get an email in the next two days asking about your experience. I hope that your e-visit has been valuable and will speed your recovery.  I have spent 5 minutes in review of e-visit questionnaire, review and updating patient chart, medical decision making and response to patient.   Johanny Segers W Taaliyah Delpriore, NP

## 2024-10-10 ENCOUNTER — Telehealth: Payer: MEDICAID | Admitting: Physician Assistant

## 2024-10-10 DIAGNOSIS — N760 Acute vaginitis: Secondary | ICD-10-CM | POA: Diagnosis not present

## 2024-10-11 NOTE — Progress Notes (Signed)

## 2024-10-12 ENCOUNTER — Telehealth: Payer: MEDICAID | Admitting: Nurse Practitioner

## 2024-10-12 ENCOUNTER — Other Ambulatory Visit: Payer: Self-pay | Admitting: Nurse Practitioner

## 2024-10-12 DIAGNOSIS — B3731 Acute candidiasis of vulva and vagina: Secondary | ICD-10-CM

## 2024-10-12 MED ORDER — FLUCONAZOLE 150 MG PO TABS: 150.0000 mg | ORAL_TABLET | Freq: Once | ORAL | 0 refills | Status: DC

## 2024-10-12 MED ORDER — FLUCONAZOLE 150 MG PO TABS: 150.0000 mg | ORAL_TABLET | Freq: Once | ORAL | 0 refills | Status: AC

## 2024-10-12 NOTE — Progress Notes (Signed)

## 2024-11-16 ENCOUNTER — Telehealth: Payer: MEDICAID | Admitting: Physician Assistant

## 2024-11-16 DIAGNOSIS — K120 Recurrent oral aphthae: Secondary | ICD-10-CM | POA: Diagnosis not present

## 2024-11-16 MED ORDER — CHLORHEXIDINE GLUCONATE 0.12 % MT SOLN: 15.0000 mL | Freq: Two times a day (BID) | OROMUCOSAL | 1 refills | Status: AC

## 2024-11-16 NOTE — Progress Notes (Signed)
 E-Visit for Mouth Ulcers  We are sorry that you are not feeling well.  Here is how we plan to help!  Based on what you have shared with me, it appears that you do have mouth ulcer(s).     The following medications should decrease the discomfort and help with healing. Chlorhexidine  mouthwash daily for 3 days . I have sent a prescription to the pharmacy.   Mouth ulcers are painful areas in the mouth and gums. These are also known as canker sores.  They can occur anywhere inside the mouth. While mostly harmless, mouth ulcers can be extremely uncomfortable and may make it difficult to eat, drink, and brush your teeth.  You may have more than 1 ulcer and they can vary and change in size. Mouth ulcers are not contagious and should not be confused with cold sores.  Cold sores appear on the lip or around the outside of the mouth and often begin with a tingling, burning or itching sensation.   While the exact causes are unknown, some common causes and factors that may aggravate mouth ulcers include: Genetics - Sometimes mouth ulcers run in families High alcohol intake Acidic foods such as citrus fruits like pineapple, grapefruit, orange fruits/juices, may aggravate mouth ulcers Other foods high in acidity or spice such as coffee, chocolate, chips, pretzels, eggs, nuts, cheese Quitting smoking Injury caused by biting the tongue or inside of the cheek Diet lacking in B-12, zinc, folic acid or iron Female hormone shifts with menstruation Excessive fatigue, emotional stress or anxiety Prevention: Talk to your doctor if you are taking meds that are known to cause mouth ulcers such as:   Anti-inflammatory drugs (for example Ibuprofen , Naproxen  sodium), pain killers, Beta blockers, Oral nicotine replacement drugs, Some street drugs (heroin).   Avoid allowing any tablets to dissolve in your mouth that are meant to swallowed whole Avoid foods/drinks that trigger or worsen symptoms Keep your mouth clean  with daily brushing and flossing  Home Care: The goal with treatment is to ease the pain where ulcers occur and help them heal as quickly as possible.  There is no medical treatment to prevent mouth ulcers from coming back or recurring.  Avoid spicy and acidic foods Eat soft foods and avoid rough, crunchy foods Avoid chewing gum Do not use toothpaste that contains sodium lauryl sulphite Use a straw to drink which helps avoid liquids toughing the ulcers near the front of your mouth Use a very soft toothbrush If you have dentures or dental hardware that you feel is not fitting well or contributing to his, please see your dentist. Use saltwater mouthwash which helps healing. Dissolve a  teaspoon of salt in a glass of warm water. Swish around your mouth and spit it out. This can be used as needed if it is soothing.   GET HELP RIGHT AWAY IF: Persistent ulcers require checking IN PERSON (face to face). Any mouth lesion lasting longer than a month should be seen by your DENTIST as soon as possible for evaluation for possible oral cancer. If you have a non-painful ulcer in 1 or more areas of your mouth Ulcers that are spreading, are very large or particularly painful Ulcers last longer than one week without improving on treatment If you develop a fever, swollen glands and begin to feel unwell Ulcers that developed after starting a new medication MAKE SURE YOU: Understand these instructions. Will watch your condition. Will get help right away if you are not doing well or get  worse.  Thank you for choosing an e-visit.  Your e-visit answers were reviewed by a board certified advanced clinical practitioner to complete your personal care plan. Depending upon the condition, your plan could have included both over the counter or prescription medications.  Please review your pharmacy choice. Make sure the pharmacy is open so you can pick up prescription now. If there is a problem, you may contact your  provider through [employer] and have the prescription routed to another pharmacy.  Your safety is important to us . If you have drug allergies check your prescription carefully.   For the next 24 hours you can use MyChart to ask questions about today's visit, request a non-urgent call back, or ask for a work or school excuse. You will get an email in the next two days asking about your experience. I hope that your e-visit has been valuable and will speed your recovery.  I have spent 5 minutes in review of e-visit questionnaire, review and updating patient chart, medical decision making and response to patient.   Elsie Velma Lunger, PA-C

## 2024-11-20 ENCOUNTER — Telehealth: Payer: MEDICAID | Admitting: Family Medicine

## 2024-11-20 DIAGNOSIS — R3989 Other symptoms and signs involving the genitourinary system: Secondary | ICD-10-CM

## 2024-11-20 MED ORDER — CEPHALEXIN 500 MG PO CAPS: 500.0000 mg | ORAL_CAPSULE | Freq: Two times a day (BID) | ORAL | 0 refills | Status: AC

## 2024-11-20 NOTE — Progress Notes (Signed)

## 2024-11-21 ENCOUNTER — Encounter (HOSPITAL_COMMUNITY): Payer: Self-pay | Admitting: Emergency Medicine

## 2024-11-21 ENCOUNTER — Other Ambulatory Visit: Payer: Self-pay

## 2024-11-21 ENCOUNTER — Ambulatory Visit (HOSPITAL_COMMUNITY)
Admission: EM | Admit: 2024-11-21 | Discharge: 2024-11-21 | Disposition: A | Discharge status: Home / Self Care | Payer: MEDICAID | Source: Home / Self Care | Attending: Family Medicine | Admitting: Family Medicine

## 2024-11-21 DIAGNOSIS — N3090 Cystitis, unspecified without hematuria: Secondary | ICD-10-CM

## 2024-11-21 DIAGNOSIS — Z3202 Encounter for pregnancy test, result negative: Secondary | ICD-10-CM

## 2024-11-21 LAB — POCT URINALYSIS DIP (MANUAL ENTRY)
Glucose, UA: NEGATIVE mg/dL
Nitrite, UA: NEGATIVE
Protein Ur, POC: >=300 mg/dL — AB
Spec Grav, UA: >=1.03 — AB
Urobilinogen, UA: 1 U/dL
pH, UA: 5.5

## 2024-11-21 LAB — POCT URINE PREGNANCY: Preg Test, Ur: NEGATIVE
